# Patient Record
Sex: Female | Born: 1935
Health system: Southern US, Community
[De-identification: ages and names within clinical notes are randomized; demographics above are authoritative.]

## PROBLEM LIST (undated history)

## (undated) DIAGNOSIS — C801 Malignant (primary) neoplasm, unspecified: Secondary | ICD-10-CM

## (undated) DIAGNOSIS — Z973 Presence of spectacles and contact lenses: Secondary | ICD-10-CM

## (undated) DIAGNOSIS — M7989 Other specified soft tissue disorders: Secondary | ICD-10-CM

## (undated) DIAGNOSIS — E785 Hyperlipidemia, unspecified: Secondary | ICD-10-CM

## (undated) DIAGNOSIS — M1990 Unspecified osteoarthritis, unspecified site: Secondary | ICD-10-CM

## (undated) DIAGNOSIS — Z972 Presence of dental prosthetic device (complete) (partial): Secondary | ICD-10-CM

## (undated) DIAGNOSIS — R569 Unspecified convulsions: Secondary | ICD-10-CM

## (undated) HISTORY — DX: Other specified soft tissue disorders: M79.89

## (undated) HISTORY — DX: Hyperlipidemia, unspecified: E78.5

## (undated) HISTORY — DX: Unspecified osteoarthritis, unspecified site: M19.90

## (undated) HISTORY — DX: Unspecified convulsions: R56.9

## (undated) HISTORY — DX: Presence of dental prosthetic device (complete) (partial): Z97.2

## (undated) HISTORY — DX: Presence of spectacles and contact lenses: Z97.3

## (undated) HISTORY — DX: Malignant (primary) neoplasm, unspecified: C80.1

---

## 1979-11-15 HISTORY — PX: ABDOMINAL HYSTERECTOMY: SHX81

## 2010-05-27 ENCOUNTER — Encounter: Admission: RE | Admit: 2010-05-27 | Discharge: 2010-05-27 | Payer: Self-pay | Admitting: Family Medicine

## 2010-06-28 ENCOUNTER — Encounter: Admission: RE | Admit: 2010-06-28 | Discharge: 2010-06-28 | Payer: Self-pay | Admitting: Surgery

## 2010-06-28 ENCOUNTER — Ambulatory Visit (HOSPITAL_COMMUNITY): Admission: RE | Admit: 2010-06-28 | Discharge: 2010-06-28 | Payer: Self-pay | Admitting: Surgery

## 2010-07-14 ENCOUNTER — Ambulatory Visit (HOSPITAL_COMMUNITY): Admission: RE | Admit: 2010-07-14 | Discharge: 2010-07-14 | Payer: Self-pay | Admitting: Surgery

## 2010-07-22 ENCOUNTER — Ambulatory Visit: Payer: Self-pay | Admitting: Oncology

## 2010-10-24 ENCOUNTER — Encounter: Payer: Self-pay | Admitting: Family Medicine

## 2010-12-16 LAB — SURGICAL PCR SCREEN: Staphylococcus aureus: POSITIVE — AB

## 2010-12-16 LAB — BASIC METABOLIC PANEL
CO2: 29 mEq/L (ref 19–32)
Calcium: 9.1 mg/dL (ref 8.4–10.5)
Chloride: 107 mEq/L (ref 96–112)
Glucose, Bld: 82 mg/dL (ref 70–99)
Potassium: 4.5 mEq/L (ref 3.5–5.1)

## 2010-12-16 LAB — DIFFERENTIAL
Basophils Relative: 0 % (ref 0–1)
Eosinophils Relative: 11 % — ABNORMAL HIGH (ref 0–5)
Lymphs Abs: 3.1 10*3/uL (ref 0.7–4.0)
Monocytes Absolute: 0.7 10*3/uL (ref 0.1–1.0)
Monocytes Relative: 8 % (ref 3–12)

## 2010-12-16 LAB — CBC
HCT: 40.8 % (ref 36.0–46.0)
Hemoglobin: 13.2 g/dL (ref 12.0–15.0)
MCH: 26.9 pg (ref 26.0–34.0)
MCHC: 32.4 g/dL (ref 30.0–36.0)
MCV: 83.8 fL (ref 78.0–100.0)
MCV: 83.4 fL (ref 78.0–100.0)
RBC: 4.89 MIL/uL (ref 3.87–5.11)
RDW: 15.9 % — ABNORMAL HIGH (ref 11.5–15.5)
RDW: 16.1 % — ABNORMAL HIGH (ref 11.5–15.5)
WBC: 9.8 10*3/uL (ref 4.0–10.5)

## 2010-12-16 LAB — COMPREHENSIVE METABOLIC PANEL
AST: 24 U/L (ref 0–37)
Albumin: 3.9 g/dL (ref 3.5–5.2)
Alkaline Phosphatase: 96 U/L (ref 39–117)
BUN: 12 mg/dL (ref 6–23)
Calcium: 9.7 mg/dL (ref 8.4–10.5)
Chloride: 103 mEq/L (ref 96–112)
Creatinine, Ser: 0.73 mg/dL (ref 0.4–1.2)
GFR calc Af Amer: >60 mL/min (ref >60)
GFR calc non Af Amer: >60 mL/min (ref >60)
Glucose, Bld: 91 mg/dL (ref 70–99)
Potassium: 4.5 mEq/L (ref 3.5–5.1)
Sodium: 140 mEq/L (ref 135–145)
Total Bilirubin: 0.3 mg/dL (ref 0.3–1.2)

## 2011-02-01 ENCOUNTER — Encounter (INDEPENDENT_AMBULATORY_CARE_PROVIDER_SITE_OTHER): Payer: Self-pay | Admitting: Surgery

## 2011-07-15 ENCOUNTER — Ambulatory Visit (INDEPENDENT_AMBULATORY_CARE_PROVIDER_SITE_OTHER): Payer: Medicare Other | Admitting: Surgery

## 2011-07-15 ENCOUNTER — Encounter (INDEPENDENT_AMBULATORY_CARE_PROVIDER_SITE_OTHER): Payer: Self-pay | Admitting: Surgery

## 2011-07-15 VITALS — BP 124/76 | HR 80 | Temp 97.4°F | Resp 16 | Ht 63.0 in | Wt 218.2 lb

## 2011-07-15 DIAGNOSIS — Z853 Personal history of malignant neoplasm of breast: Secondary | ICD-10-CM

## 2011-07-15 NOTE — Patient Instructions (Signed)
Follow up in 1 year.

## 2011-07-15 NOTE — Progress Notes (Signed)
Subjective:     Patient ID: Lisa Phillips, female   DOB: 1930/11/29, 77 y.o.   MRN: [id number]  HPI The patient presents for a 6 month followup after left breast lumpectomy for DCIS. Date of surgery was 1012 2011. She is doing well. She has no complaints. She is followed closely and aspirate. She is due for mammogram this month. She denies any breast pain, breast mass, nipple discharge or any new changes to her breast. She is to have knee replacement surgery she tells me.   Review of Systems  Constitutional: Negative.   HENT: Negative.   Respiratory: Negative for cough and chest tightness.   Genitourinary: Negative.   Musculoskeletal: Positive for joint swelling, arthralgias and gait problem.       Objective:   Physical Exam  Constitutional: She appears well-developed and well-nourished.  HENT:  Head: Normocephalic and atraumatic.  Eyes: EOM are normal. Pupils are equal, round, and reactive to light.  Neck: Normal range of motion. Neck supple.  Pulmonary/Chest:       Left breast shows postsurgical scar around left lateral nipple. No masses noted. Left visible normal. No right breast mass palpated today. Right axilla normal       Assessment:     Left breast DCIS HISTORY Date of surgery 07/14/2011 lumpectomy    Plan:     Follow up 1 year.   Mammogram this month in [place].

## 2011-10-05 DIAGNOSIS — M1990 Unspecified osteoarthritis, unspecified site: Secondary | ICD-10-CM | POA: Diagnosis not present

## 2011-10-05 DIAGNOSIS — J111 Influenza due to unidentified influenza virus with other respiratory manifestations: Secondary | ICD-10-CM | POA: Diagnosis not present

## 2011-10-21 DIAGNOSIS — Z01818 Encounter for other preprocedural examination: Secondary | ICD-10-CM | POA: Diagnosis not present

## 2011-10-25 DIAGNOSIS — G40909 Epilepsy, unspecified, not intractable, without status epilepticus: Secondary | ICD-10-CM | POA: Diagnosis not present

## 2011-10-25 DIAGNOSIS — M1710 Unilateral primary osteoarthritis, unspecified knee: Secondary | ICD-10-CM | POA: Diagnosis not present

## 2011-10-25 DIAGNOSIS — E559 Vitamin D deficiency, unspecified: Secondary | ICD-10-CM | POA: Diagnosis not present

## 2011-10-25 DIAGNOSIS — R269 Unspecified abnormalities of gait and mobility: Secondary | ICD-10-CM | POA: Diagnosis not present

## 2011-10-25 DIAGNOSIS — Z96659 Presence of unspecified artificial knee joint: Secondary | ICD-10-CM | POA: Diagnosis not present

## 2011-10-25 DIAGNOSIS — G8918 Other acute postprocedural pain: Secondary | ICD-10-CM | POA: Diagnosis not present

## 2011-10-25 DIAGNOSIS — E785 Hyperlipidemia, unspecified: Secondary | ICD-10-CM | POA: Diagnosis not present

## 2011-10-25 DIAGNOSIS — M25569 Pain in unspecified knee: Secondary | ICD-10-CM | POA: Diagnosis not present

## 2011-10-25 DIAGNOSIS — R569 Unspecified convulsions: Secondary | ICD-10-CM | POA: Diagnosis not present

## 2011-10-25 DIAGNOSIS — Z471 Aftercare following joint replacement surgery: Secondary | ICD-10-CM | POA: Diagnosis not present

## 2011-10-28 DIAGNOSIS — D638 Anemia in other chronic diseases classified elsewhere: Secondary | ICD-10-CM | POA: Diagnosis not present

## 2011-10-28 DIAGNOSIS — N390 Urinary tract infection, site not specified: Secondary | ICD-10-CM | POA: Diagnosis not present

## 2011-10-28 DIAGNOSIS — R569 Unspecified convulsions: Secondary | ICD-10-CM | POA: Diagnosis not present

## 2011-10-28 DIAGNOSIS — Z471 Aftercare following joint replacement surgery: Secondary | ICD-10-CM | POA: Diagnosis not present

## 2011-10-28 DIAGNOSIS — R269 Unspecified abnormalities of gait and mobility: Secondary | ICD-10-CM | POA: Diagnosis not present

## 2011-10-28 DIAGNOSIS — M1710 Unilateral primary osteoarthritis, unspecified knee: Secondary | ICD-10-CM | POA: Diagnosis not present

## 2011-10-28 DIAGNOSIS — M25569 Pain in unspecified knee: Secondary | ICD-10-CM | POA: Diagnosis not present

## 2011-10-28 DIAGNOSIS — Z96659 Presence of unspecified artificial knee joint: Secondary | ICD-10-CM | POA: Diagnosis not present

## 2011-10-28 DIAGNOSIS — E785 Hyperlipidemia, unspecified: Secondary | ICD-10-CM | POA: Diagnosis not present

## 2011-10-28 DIAGNOSIS — G8918 Other acute postprocedural pain: Secondary | ICD-10-CM | POA: Diagnosis not present

## 2011-10-28 DIAGNOSIS — E559 Vitamin D deficiency, unspecified: Secondary | ICD-10-CM | POA: Diagnosis not present

## 2011-10-31 DIAGNOSIS — E785 Hyperlipidemia, unspecified: Secondary | ICD-10-CM | POA: Diagnosis not present

## 2011-10-31 DIAGNOSIS — D638 Anemia in other chronic diseases classified elsewhere: Secondary | ICD-10-CM | POA: Diagnosis not present

## 2011-10-31 DIAGNOSIS — Z96659 Presence of unspecified artificial knee joint: Secondary | ICD-10-CM | POA: Diagnosis not present

## 2011-10-31 DIAGNOSIS — G8918 Other acute postprocedural pain: Secondary | ICD-10-CM | POA: Diagnosis not present

## 2011-11-14 DIAGNOSIS — Z7901 Long term (current) use of anticoagulants: Secondary | ICD-10-CM | POA: Diagnosis not present

## 2011-11-14 DIAGNOSIS — Z5181 Encounter for therapeutic drug level monitoring: Secondary | ICD-10-CM | POA: Diagnosis not present

## 2011-11-14 DIAGNOSIS — Z79899 Other long term (current) drug therapy: Secondary | ICD-10-CM | POA: Diagnosis not present

## 2011-11-15 DIAGNOSIS — IMO0001 Reserved for inherently not codable concepts without codable children: Secondary | ICD-10-CM | POA: Diagnosis not present

## 2011-11-15 DIAGNOSIS — R269 Unspecified abnormalities of gait and mobility: Secondary | ICD-10-CM | POA: Diagnosis not present

## 2011-11-15 DIAGNOSIS — Z96659 Presence of unspecified artificial knee joint: Secondary | ICD-10-CM | POA: Diagnosis not present

## 2011-11-17 DIAGNOSIS — Z96659 Presence of unspecified artificial knee joint: Secondary | ICD-10-CM | POA: Diagnosis not present

## 2011-11-17 DIAGNOSIS — IMO0001 Reserved for inherently not codable concepts without codable children: Secondary | ICD-10-CM | POA: Diagnosis not present

## 2011-11-17 DIAGNOSIS — R269 Unspecified abnormalities of gait and mobility: Secondary | ICD-10-CM | POA: Diagnosis not present

## 2011-11-21 DIAGNOSIS — IMO0001 Reserved for inherently not codable concepts without codable children: Secondary | ICD-10-CM | POA: Diagnosis not present

## 2011-11-21 DIAGNOSIS — R269 Unspecified abnormalities of gait and mobility: Secondary | ICD-10-CM | POA: Diagnosis not present

## 2011-11-21 DIAGNOSIS — Z96659 Presence of unspecified artificial knee joint: Secondary | ICD-10-CM | POA: Diagnosis not present

## 2011-11-22 DIAGNOSIS — Z96659 Presence of unspecified artificial knee joint: Secondary | ICD-10-CM | POA: Diagnosis not present

## 2011-11-22 DIAGNOSIS — R269 Unspecified abnormalities of gait and mobility: Secondary | ICD-10-CM | POA: Diagnosis not present

## 2011-11-22 DIAGNOSIS — IMO0001 Reserved for inherently not codable concepts without codable children: Secondary | ICD-10-CM | POA: Diagnosis not present

## 2011-11-23 DIAGNOSIS — Z96659 Presence of unspecified artificial knee joint: Secondary | ICD-10-CM | POA: Diagnosis not present

## 2011-11-23 DIAGNOSIS — IMO0001 Reserved for inherently not codable concepts without codable children: Secondary | ICD-10-CM | POA: Diagnosis not present

## 2011-11-23 DIAGNOSIS — R269 Unspecified abnormalities of gait and mobility: Secondary | ICD-10-CM | POA: Diagnosis not present

## 2011-11-24 DIAGNOSIS — E559 Vitamin D deficiency, unspecified: Secondary | ICD-10-CM | POA: Diagnosis not present

## 2011-11-24 DIAGNOSIS — E782 Mixed hyperlipidemia: Secondary | ICD-10-CM | POA: Diagnosis not present

## 2011-11-24 DIAGNOSIS — R269 Unspecified abnormalities of gait and mobility: Secondary | ICD-10-CM | POA: Diagnosis not present

## 2011-11-24 DIAGNOSIS — Z7901 Long term (current) use of anticoagulants: Secondary | ICD-10-CM | POA: Diagnosis not present

## 2011-11-24 DIAGNOSIS — IMO0001 Reserved for inherently not codable concepts without codable children: Secondary | ICD-10-CM | POA: Diagnosis not present

## 2011-11-24 DIAGNOSIS — Z96659 Presence of unspecified artificial knee joint: Secondary | ICD-10-CM | POA: Diagnosis not present

## 2011-11-24 DIAGNOSIS — Z79899 Other long term (current) drug therapy: Secondary | ICD-10-CM | POA: Diagnosis not present

## 2011-11-24 DIAGNOSIS — M1990 Unspecified osteoarthritis, unspecified site: Secondary | ICD-10-CM | POA: Diagnosis not present

## 2011-11-28 DIAGNOSIS — Z96659 Presence of unspecified artificial knee joint: Secondary | ICD-10-CM | POA: Diagnosis not present

## 2011-11-28 DIAGNOSIS — R269 Unspecified abnormalities of gait and mobility: Secondary | ICD-10-CM | POA: Diagnosis not present

## 2011-11-28 DIAGNOSIS — IMO0001 Reserved for inherently not codable concepts without codable children: Secondary | ICD-10-CM | POA: Diagnosis not present

## 2011-11-29 DIAGNOSIS — Z96659 Presence of unspecified artificial knee joint: Secondary | ICD-10-CM | POA: Diagnosis not present

## 2011-11-29 DIAGNOSIS — IMO0001 Reserved for inherently not codable concepts without codable children: Secondary | ICD-10-CM | POA: Diagnosis not present

## 2011-11-29 DIAGNOSIS — R269 Unspecified abnormalities of gait and mobility: Secondary | ICD-10-CM | POA: Diagnosis not present

## 2011-12-01 DIAGNOSIS — Z96659 Presence of unspecified artificial knee joint: Secondary | ICD-10-CM | POA: Diagnosis not present

## 2011-12-01 DIAGNOSIS — R269 Unspecified abnormalities of gait and mobility: Secondary | ICD-10-CM | POA: Diagnosis not present

## 2011-12-01 DIAGNOSIS — IMO0001 Reserved for inherently not codable concepts without codable children: Secondary | ICD-10-CM | POA: Diagnosis not present

## 2011-12-05 DIAGNOSIS — Z96659 Presence of unspecified artificial knee joint: Secondary | ICD-10-CM | POA: Diagnosis not present

## 2011-12-05 DIAGNOSIS — R269 Unspecified abnormalities of gait and mobility: Secondary | ICD-10-CM | POA: Diagnosis not present

## 2011-12-05 DIAGNOSIS — IMO0001 Reserved for inherently not codable concepts without codable children: Secondary | ICD-10-CM | POA: Diagnosis not present

## 2011-12-06 DIAGNOSIS — R269 Unspecified abnormalities of gait and mobility: Secondary | ICD-10-CM | POA: Diagnosis not present

## 2011-12-06 DIAGNOSIS — Z96659 Presence of unspecified artificial knee joint: Secondary | ICD-10-CM | POA: Diagnosis not present

## 2011-12-06 DIAGNOSIS — IMO0001 Reserved for inherently not codable concepts without codable children: Secondary | ICD-10-CM | POA: Diagnosis not present

## 2011-12-09 DIAGNOSIS — M25569 Pain in unspecified knee: Secondary | ICD-10-CM | POA: Diagnosis not present

## 2011-12-12 DIAGNOSIS — C50119 Malignant neoplasm of central portion of unspecified female breast: Secondary | ICD-10-CM | POA: Diagnosis not present

## 2011-12-14 DIAGNOSIS — M25569 Pain in unspecified knee: Secondary | ICD-10-CM | POA: Diagnosis not present

## 2011-12-16 DIAGNOSIS — M25569 Pain in unspecified knee: Secondary | ICD-10-CM | POA: Diagnosis not present

## 2011-12-21 DIAGNOSIS — M1710 Unilateral primary osteoarthritis, unspecified knee: Secondary | ICD-10-CM | POA: Diagnosis not present

## 2011-12-26 DIAGNOSIS — M25569 Pain in unspecified knee: Secondary | ICD-10-CM | POA: Diagnosis not present

## 2011-12-29 DIAGNOSIS — M25569 Pain in unspecified knee: Secondary | ICD-10-CM | POA: Diagnosis not present

## 2012-01-13 DIAGNOSIS — M25569 Pain in unspecified knee: Secondary | ICD-10-CM | POA: Diagnosis not present

## 2012-01-16 DIAGNOSIS — M25569 Pain in unspecified knee: Secondary | ICD-10-CM | POA: Diagnosis not present

## 2012-01-26 DIAGNOSIS — M25569 Pain in unspecified knee: Secondary | ICD-10-CM | POA: Diagnosis not present

## 2012-02-20 DIAGNOSIS — M1710 Unilateral primary osteoarthritis, unspecified knee: Secondary | ICD-10-CM | POA: Diagnosis not present

## 2012-02-20 DIAGNOSIS — M25569 Pain in unspecified knee: Secondary | ICD-10-CM | POA: Diagnosis not present

## 2012-02-20 DIAGNOSIS — Z6836 Body mass index (BMI) 36.0-36.9, adult: Secondary | ICD-10-CM | POA: Diagnosis not present

## 2012-04-30 DIAGNOSIS — N6009 Solitary cyst of unspecified breast: Secondary | ICD-10-CM | POA: Diagnosis not present

## 2012-04-30 DIAGNOSIS — C50119 Malignant neoplasm of central portion of unspecified female breast: Secondary | ICD-10-CM | POA: Diagnosis not present

## 2012-05-04 DIAGNOSIS — M1710 Unilateral primary osteoarthritis, unspecified knee: Secondary | ICD-10-CM | POA: Diagnosis not present

## 2012-05-04 DIAGNOSIS — Z09 Encounter for follow-up examination after completed treatment for conditions other than malignant neoplasm: Secondary | ICD-10-CM | POA: Diagnosis not present

## 2012-05-04 DIAGNOSIS — Z853 Personal history of malignant neoplasm of breast: Secondary | ICD-10-CM | POA: Diagnosis not present

## 2012-05-04 DIAGNOSIS — M25569 Pain in unspecified knee: Secondary | ICD-10-CM | POA: Diagnosis not present

## 2012-05-16 DIAGNOSIS — M1710 Unilateral primary osteoarthritis, unspecified knee: Secondary | ICD-10-CM | POA: Diagnosis not present

## 2012-05-23 DIAGNOSIS — M1710 Unilateral primary osteoarthritis, unspecified knee: Secondary | ICD-10-CM | POA: Diagnosis not present

## 2012-05-30 DIAGNOSIS — M1710 Unilateral primary osteoarthritis, unspecified knee: Secondary | ICD-10-CM | POA: Diagnosis not present

## 2012-06-11 DIAGNOSIS — M1710 Unilateral primary osteoarthritis, unspecified knee: Secondary | ICD-10-CM | POA: Diagnosis not present

## 2012-06-11 DIAGNOSIS — M25569 Pain in unspecified knee: Secondary | ICD-10-CM | POA: Diagnosis not present

## 2012-06-13 DIAGNOSIS — H2700 Aphakia, unspecified eye: Secondary | ICD-10-CM | POA: Diagnosis not present

## 2012-07-06 DIAGNOSIS — Z23 Encounter for immunization: Secondary | ICD-10-CM | POA: Diagnosis not present

## 2012-07-11 DIAGNOSIS — M1710 Unilateral primary osteoarthritis, unspecified knee: Secondary | ICD-10-CM | POA: Diagnosis not present

## 2012-07-11 DIAGNOSIS — M25569 Pain in unspecified knee: Secondary | ICD-10-CM | POA: Diagnosis not present

## 2012-07-17 DIAGNOSIS — E559 Vitamin D deficiency, unspecified: Secondary | ICD-10-CM | POA: Diagnosis not present

## 2012-07-17 DIAGNOSIS — E782 Mixed hyperlipidemia: Secondary | ICD-10-CM | POA: Diagnosis not present

## 2012-07-17 DIAGNOSIS — G40909 Epilepsy, unspecified, not intractable, without status epilepticus: Secondary | ICD-10-CM | POA: Diagnosis not present

## 2012-07-17 DIAGNOSIS — Z79899 Other long term (current) drug therapy: Secondary | ICD-10-CM | POA: Diagnosis not present

## 2012-11-02 DIAGNOSIS — N760 Acute vaginitis: Secondary | ICD-10-CM | POA: Diagnosis not present

## 2012-12-17 DIAGNOSIS — M6281 Muscle weakness (generalized): Secondary | ICD-10-CM | POA: Diagnosis not present

## 2012-12-17 DIAGNOSIS — M1710 Unilateral primary osteoarthritis, unspecified knee: Secondary | ICD-10-CM | POA: Diagnosis not present

## 2013-01-14 DIAGNOSIS — H1045 Other chronic allergic conjunctivitis: Secondary | ICD-10-CM | POA: Diagnosis not present

## 2013-01-15 DIAGNOSIS — Z1331 Encounter for screening for depression: Secondary | ICD-10-CM | POA: Diagnosis not present

## 2013-01-15 DIAGNOSIS — E782 Mixed hyperlipidemia: Secondary | ICD-10-CM | POA: Diagnosis not present

## 2013-01-15 DIAGNOSIS — Z9181 History of falling: Secondary | ICD-10-CM | POA: Diagnosis not present

## 2013-01-15 DIAGNOSIS — N183 Chronic kidney disease, stage 3 unspecified: Secondary | ICD-10-CM | POA: Diagnosis not present

## 2013-01-15 DIAGNOSIS — E559 Vitamin D deficiency, unspecified: Secondary | ICD-10-CM | POA: Diagnosis not present

## 2013-01-15 DIAGNOSIS — G40909 Epilepsy, unspecified, not intractable, without status epilepticus: Secondary | ICD-10-CM | POA: Diagnosis not present

## 2013-01-15 DIAGNOSIS — Z79899 Other long term (current) drug therapy: Secondary | ICD-10-CM | POA: Diagnosis not present

## 2013-01-15 DIAGNOSIS — N039 Chronic nephritic syndrome with unspecified morphologic changes: Secondary | ICD-10-CM | POA: Diagnosis not present

## 2013-02-19 DIAGNOSIS — M1710 Unilateral primary osteoarthritis, unspecified knee: Secondary | ICD-10-CM | POA: Diagnosis not present

## 2013-02-26 DIAGNOSIS — Z6838 Body mass index (BMI) 38.0-38.9, adult: Secondary | ICD-10-CM | POA: Diagnosis not present

## 2013-02-26 DIAGNOSIS — R9431 Abnormal electrocardiogram [ECG] [EKG]: Secondary | ICD-10-CM | POA: Diagnosis not present

## 2013-02-26 DIAGNOSIS — Z01818 Encounter for other preprocedural examination: Secondary | ICD-10-CM | POA: Diagnosis not present

## 2013-02-26 DIAGNOSIS — M1990 Unspecified osteoarthritis, unspecified site: Secondary | ICD-10-CM | POA: Diagnosis not present

## 2013-03-05 DIAGNOSIS — R5383 Other fatigue: Secondary | ICD-10-CM | POA: Diagnosis not present

## 2013-03-05 DIAGNOSIS — Z7901 Long term (current) use of anticoagulants: Secondary | ICD-10-CM | POA: Diagnosis not present

## 2013-03-05 DIAGNOSIS — R9431 Abnormal electrocardiogram [ECG] [EKG]: Secondary | ICD-10-CM | POA: Diagnosis not present

## 2013-03-05 DIAGNOSIS — Z01818 Encounter for other preprocedural examination: Secondary | ICD-10-CM | POA: Diagnosis not present

## 2013-03-05 DIAGNOSIS — R5381 Other malaise: Secondary | ICD-10-CM | POA: Diagnosis not present

## 2013-03-11 DIAGNOSIS — M1710 Unilateral primary osteoarthritis, unspecified knee: Secondary | ICD-10-CM | POA: Diagnosis not present

## 2013-03-12 DIAGNOSIS — G8918 Other acute postprocedural pain: Secondary | ICD-10-CM | POA: Diagnosis not present

## 2013-03-12 DIAGNOSIS — E785 Hyperlipidemia, unspecified: Secondary | ICD-10-CM | POA: Diagnosis not present

## 2013-03-12 DIAGNOSIS — E559 Vitamin D deficiency, unspecified: Secondary | ICD-10-CM | POA: Diagnosis present

## 2013-03-12 DIAGNOSIS — Z862 Personal history of diseases of the blood and blood-forming organs and certain disorders involving the immune mechanism: Secondary | ICD-10-CM | POA: Diagnosis not present

## 2013-03-12 DIAGNOSIS — G40909 Epilepsy, unspecified, not intractable, without status epilepticus: Secondary | ICD-10-CM | POA: Diagnosis not present

## 2013-03-12 DIAGNOSIS — D638 Anemia in other chronic diseases classified elsewhere: Secondary | ICD-10-CM | POA: Diagnosis not present

## 2013-03-12 DIAGNOSIS — J4489 Other specified chronic obstructive pulmonary disease: Secondary | ICD-10-CM | POA: Diagnosis not present

## 2013-03-12 DIAGNOSIS — M818 Other osteoporosis without current pathological fracture: Secondary | ICD-10-CM | POA: Diagnosis not present

## 2013-03-12 DIAGNOSIS — D649 Anemia, unspecified: Secondary | ICD-10-CM | POA: Diagnosis present

## 2013-03-12 DIAGNOSIS — K219 Gastro-esophageal reflux disease without esophagitis: Secondary | ICD-10-CM | POA: Diagnosis not present

## 2013-03-12 DIAGNOSIS — Z85828 Personal history of other malignant neoplasm of skin: Secondary | ICD-10-CM | POA: Diagnosis not present

## 2013-03-12 DIAGNOSIS — R030 Elevated blood-pressure reading, without diagnosis of hypertension: Secondary | ICD-10-CM | POA: Diagnosis present

## 2013-03-12 DIAGNOSIS — R569 Unspecified convulsions: Secondary | ICD-10-CM | POA: Diagnosis not present

## 2013-03-12 DIAGNOSIS — J9600 Acute respiratory failure, unspecified whether with hypoxia or hypercapnia: Secondary | ICD-10-CM | POA: Diagnosis not present

## 2013-03-12 DIAGNOSIS — I10 Essential (primary) hypertension: Secondary | ICD-10-CM | POA: Diagnosis not present

## 2013-03-12 DIAGNOSIS — IMO0002 Reserved for concepts with insufficient information to code with codable children: Secondary | ICD-10-CM | POA: Diagnosis not present

## 2013-03-12 DIAGNOSIS — M159 Polyosteoarthritis, unspecified: Secondary | ICD-10-CM | POA: Diagnosis present

## 2013-03-12 DIAGNOSIS — Z853 Personal history of malignant neoplasm of breast: Secondary | ICD-10-CM | POA: Diagnosis not present

## 2013-03-12 DIAGNOSIS — M1710 Unilateral primary osteoarthritis, unspecified knee: Secondary | ICD-10-CM | POA: Diagnosis not present

## 2013-03-12 DIAGNOSIS — Z96659 Presence of unspecified artificial knee joint: Secondary | ICD-10-CM | POA: Diagnosis not present

## 2013-03-12 DIAGNOSIS — Z22321 Carrier or suspected carrier of Methicillin susceptible Staphylococcus aureus: Secondary | ICD-10-CM | POA: Diagnosis not present

## 2013-03-12 DIAGNOSIS — J9620 Acute and chronic respiratory failure, unspecified whether with hypoxia or hypercapnia: Secondary | ICD-10-CM | POA: Diagnosis not present

## 2013-03-12 DIAGNOSIS — R269 Unspecified abnormalities of gait and mobility: Secondary | ICD-10-CM | POA: Diagnosis not present

## 2013-03-12 DIAGNOSIS — M810 Age-related osteoporosis without current pathological fracture: Secondary | ICD-10-CM | POA: Diagnosis not present

## 2013-03-12 DIAGNOSIS — J95822 Acute and chronic postprocedural respiratory failure: Secondary | ICD-10-CM | POA: Diagnosis not present

## 2013-03-12 DIAGNOSIS — J449 Chronic obstructive pulmonary disease, unspecified: Secondary | ICD-10-CM | POA: Diagnosis not present

## 2013-03-13 DIAGNOSIS — J9620 Acute and chronic respiratory failure, unspecified whether with hypoxia or hypercapnia: Secondary | ICD-10-CM | POA: Diagnosis not present

## 2013-03-13 DIAGNOSIS — K219 Gastro-esophageal reflux disease without esophagitis: Secondary | ICD-10-CM | POA: Diagnosis not present

## 2013-03-13 DIAGNOSIS — G40909 Epilepsy, unspecified, not intractable, without status epilepticus: Secondary | ICD-10-CM | POA: Diagnosis not present

## 2013-03-13 DIAGNOSIS — E785 Hyperlipidemia, unspecified: Secondary | ICD-10-CM | POA: Diagnosis not present

## 2013-03-14 DIAGNOSIS — Z96659 Presence of unspecified artificial knee joint: Secondary | ICD-10-CM | POA: Diagnosis not present

## 2013-03-14 DIAGNOSIS — E785 Hyperlipidemia, unspecified: Secondary | ICD-10-CM | POA: Diagnosis not present

## 2013-03-14 DIAGNOSIS — J9620 Acute and chronic respiratory failure, unspecified whether with hypoxia or hypercapnia: Secondary | ICD-10-CM | POA: Diagnosis not present

## 2013-03-14 DIAGNOSIS — K219 Gastro-esophageal reflux disease without esophagitis: Secondary | ICD-10-CM | POA: Diagnosis not present

## 2013-03-15 DIAGNOSIS — R569 Unspecified convulsions: Secondary | ICD-10-CM | POA: Diagnosis not present

## 2013-03-15 DIAGNOSIS — M1710 Unilateral primary osteoarthritis, unspecified knee: Secondary | ICD-10-CM | POA: Diagnosis not present

## 2013-03-15 DIAGNOSIS — K219 Gastro-esophageal reflux disease without esophagitis: Secondary | ICD-10-CM | POA: Diagnosis not present

## 2013-03-15 DIAGNOSIS — D638 Anemia in other chronic diseases classified elsewhere: Secondary | ICD-10-CM | POA: Diagnosis not present

## 2013-03-15 DIAGNOSIS — I10 Essential (primary) hypertension: Secondary | ICD-10-CM | POA: Diagnosis not present

## 2013-03-15 DIAGNOSIS — Z862 Personal history of diseases of the blood and blood-forming organs and certain disorders involving the immune mechanism: Secondary | ICD-10-CM | POA: Diagnosis not present

## 2013-03-15 DIAGNOSIS — Z85828 Personal history of other malignant neoplasm of skin: Secondary | ICD-10-CM | POA: Diagnosis not present

## 2013-03-15 DIAGNOSIS — M810 Age-related osteoporosis without current pathological fracture: Secondary | ICD-10-CM | POA: Diagnosis not present

## 2013-03-15 DIAGNOSIS — Z96659 Presence of unspecified artificial knee joint: Secondary | ICD-10-CM | POA: Diagnosis not present

## 2013-03-15 DIAGNOSIS — J9600 Acute respiratory failure, unspecified whether with hypoxia or hypercapnia: Secondary | ICD-10-CM | POA: Diagnosis not present

## 2013-03-15 DIAGNOSIS — G8918 Other acute postprocedural pain: Secondary | ICD-10-CM | POA: Diagnosis not present

## 2013-03-15 DIAGNOSIS — IMO0002 Reserved for concepts with insufficient information to code with codable children: Secondary | ICD-10-CM | POA: Diagnosis not present

## 2013-03-15 DIAGNOSIS — E785 Hyperlipidemia, unspecified: Secondary | ICD-10-CM | POA: Diagnosis not present

## 2013-03-15 DIAGNOSIS — G40909 Epilepsy, unspecified, not intractable, without status epilepticus: Secondary | ICD-10-CM | POA: Diagnosis not present

## 2013-03-15 DIAGNOSIS — Z853 Personal history of malignant neoplasm of breast: Secondary | ICD-10-CM | POA: Diagnosis not present

## 2013-03-15 DIAGNOSIS — J449 Chronic obstructive pulmonary disease, unspecified: Secondary | ICD-10-CM | POA: Diagnosis not present

## 2013-03-20 DIAGNOSIS — Z96659 Presence of unspecified artificial knee joint: Secondary | ICD-10-CM | POA: Diagnosis not present

## 2013-03-20 DIAGNOSIS — R569 Unspecified convulsions: Secondary | ICD-10-CM | POA: Diagnosis not present

## 2013-03-20 DIAGNOSIS — G8918 Other acute postprocedural pain: Secondary | ICD-10-CM | POA: Diagnosis not present

## 2013-03-20 DIAGNOSIS — D638 Anemia in other chronic diseases classified elsewhere: Secondary | ICD-10-CM | POA: Diagnosis not present

## 2013-03-31 DIAGNOSIS — M6281 Muscle weakness (generalized): Secondary | ICD-10-CM | POA: Diagnosis not present

## 2013-03-31 DIAGNOSIS — R269 Unspecified abnormalities of gait and mobility: Secondary | ICD-10-CM | POA: Diagnosis not present

## 2013-03-31 DIAGNOSIS — Z471 Aftercare following joint replacement surgery: Secondary | ICD-10-CM | POA: Diagnosis not present

## 2013-03-31 DIAGNOSIS — IMO0001 Reserved for inherently not codable concepts without codable children: Secondary | ICD-10-CM | POA: Diagnosis not present

## 2013-03-31 DIAGNOSIS — M25569 Pain in unspecified knee: Secondary | ICD-10-CM | POA: Diagnosis not present

## 2013-03-31 DIAGNOSIS — Z96659 Presence of unspecified artificial knee joint: Secondary | ICD-10-CM | POA: Diagnosis not present

## 2013-04-02 DIAGNOSIS — IMO0001 Reserved for inherently not codable concepts without codable children: Secondary | ICD-10-CM | POA: Diagnosis not present

## 2013-04-02 DIAGNOSIS — M6281 Muscle weakness (generalized): Secondary | ICD-10-CM | POA: Diagnosis not present

## 2013-04-02 DIAGNOSIS — R269 Unspecified abnormalities of gait and mobility: Secondary | ICD-10-CM | POA: Diagnosis not present

## 2013-04-02 DIAGNOSIS — Z471 Aftercare following joint replacement surgery: Secondary | ICD-10-CM | POA: Diagnosis not present

## 2013-04-02 DIAGNOSIS — Z96659 Presence of unspecified artificial knee joint: Secondary | ICD-10-CM | POA: Diagnosis not present

## 2013-04-02 DIAGNOSIS — M25569 Pain in unspecified knee: Secondary | ICD-10-CM | POA: Diagnosis not present

## 2013-04-04 DIAGNOSIS — M25569 Pain in unspecified knee: Secondary | ICD-10-CM | POA: Diagnosis not present

## 2013-04-04 DIAGNOSIS — Z96659 Presence of unspecified artificial knee joint: Secondary | ICD-10-CM | POA: Diagnosis not present

## 2013-04-04 DIAGNOSIS — IMO0001 Reserved for inherently not codable concepts without codable children: Secondary | ICD-10-CM | POA: Diagnosis not present

## 2013-04-04 DIAGNOSIS — R269 Unspecified abnormalities of gait and mobility: Secondary | ICD-10-CM | POA: Diagnosis not present

## 2013-04-04 DIAGNOSIS — M6281 Muscle weakness (generalized): Secondary | ICD-10-CM | POA: Diagnosis not present

## 2013-04-04 DIAGNOSIS — Z471 Aftercare following joint replacement surgery: Secondary | ICD-10-CM | POA: Diagnosis not present

## 2013-04-08 DIAGNOSIS — M25569 Pain in unspecified knee: Secondary | ICD-10-CM | POA: Diagnosis not present

## 2013-04-08 DIAGNOSIS — IMO0001 Reserved for inherently not codable concepts without codable children: Secondary | ICD-10-CM | POA: Diagnosis not present

## 2013-04-08 DIAGNOSIS — Z471 Aftercare following joint replacement surgery: Secondary | ICD-10-CM | POA: Diagnosis not present

## 2013-04-08 DIAGNOSIS — Z96659 Presence of unspecified artificial knee joint: Secondary | ICD-10-CM | POA: Diagnosis not present

## 2013-04-08 DIAGNOSIS — M6281 Muscle weakness (generalized): Secondary | ICD-10-CM | POA: Diagnosis not present

## 2013-04-08 DIAGNOSIS — R269 Unspecified abnormalities of gait and mobility: Secondary | ICD-10-CM | POA: Diagnosis not present

## 2013-04-09 DIAGNOSIS — M25569 Pain in unspecified knee: Secondary | ICD-10-CM | POA: Diagnosis not present

## 2013-04-09 DIAGNOSIS — IMO0001 Reserved for inherently not codable concepts without codable children: Secondary | ICD-10-CM | POA: Diagnosis not present

## 2013-04-09 DIAGNOSIS — R269 Unspecified abnormalities of gait and mobility: Secondary | ICD-10-CM | POA: Diagnosis not present

## 2013-04-09 DIAGNOSIS — M6281 Muscle weakness (generalized): Secondary | ICD-10-CM | POA: Diagnosis not present

## 2013-04-09 DIAGNOSIS — Z96659 Presence of unspecified artificial knee joint: Secondary | ICD-10-CM | POA: Diagnosis not present

## 2013-04-09 DIAGNOSIS — Z471 Aftercare following joint replacement surgery: Secondary | ICD-10-CM | POA: Diagnosis not present

## 2013-04-10 DIAGNOSIS — M6281 Muscle weakness (generalized): Secondary | ICD-10-CM | POA: Diagnosis not present

## 2013-04-10 DIAGNOSIS — IMO0001 Reserved for inherently not codable concepts without codable children: Secondary | ICD-10-CM | POA: Diagnosis not present

## 2013-04-10 DIAGNOSIS — R269 Unspecified abnormalities of gait and mobility: Secondary | ICD-10-CM | POA: Diagnosis not present

## 2013-04-10 DIAGNOSIS — Z471 Aftercare following joint replacement surgery: Secondary | ICD-10-CM | POA: Diagnosis not present

## 2013-04-10 DIAGNOSIS — M25569 Pain in unspecified knee: Secondary | ICD-10-CM | POA: Diagnosis not present

## 2013-04-10 DIAGNOSIS — Z96659 Presence of unspecified artificial knee joint: Secondary | ICD-10-CM | POA: Diagnosis not present

## 2013-04-12 DIAGNOSIS — R609 Edema, unspecified: Secondary | ICD-10-CM | POA: Diagnosis not present

## 2013-04-12 DIAGNOSIS — D649 Anemia, unspecified: Secondary | ICD-10-CM | POA: Diagnosis not present

## 2013-05-02 DIAGNOSIS — L821 Other seborrheic keratosis: Secondary | ICD-10-CM | POA: Diagnosis not present

## 2013-05-02 DIAGNOSIS — C44211 Basal cell carcinoma of skin of unspecified ear and external auricular canal: Secondary | ICD-10-CM | POA: Diagnosis not present

## 2013-05-02 DIAGNOSIS — L570 Actinic keratosis: Secondary | ICD-10-CM | POA: Diagnosis not present

## 2013-05-07 DIAGNOSIS — C4330 Malignant melanoma of unspecified part of face: Secondary | ICD-10-CM | POA: Diagnosis not present

## 2013-05-13 DIAGNOSIS — C50119 Malignant neoplasm of central portion of unspecified female breast: Secondary | ICD-10-CM | POA: Diagnosis not present

## 2013-05-16 DIAGNOSIS — Z853 Personal history of malignant neoplasm of breast: Secondary | ICD-10-CM | POA: Diagnosis not present

## 2013-05-16 DIAGNOSIS — Z09 Encounter for follow-up examination after completed treatment for conditions other than malignant neoplasm: Secondary | ICD-10-CM | POA: Diagnosis not present

## 2013-05-22 DIAGNOSIS — C44211 Basal cell carcinoma of skin of unspecified ear and external auricular canal: Secondary | ICD-10-CM | POA: Diagnosis not present

## 2013-05-28 DIAGNOSIS — C4330 Malignant melanoma of unspecified part of face: Secondary | ICD-10-CM | POA: Diagnosis not present

## 2013-06-05 DIAGNOSIS — Z96659 Presence of unspecified artificial knee joint: Secondary | ICD-10-CM | POA: Diagnosis not present

## 2013-06-12 DIAGNOSIS — Z86010 Personal history of colonic polyps: Secondary | ICD-10-CM | POA: Diagnosis not present

## 2013-06-12 DIAGNOSIS — K5730 Diverticulosis of large intestine without perforation or abscess without bleeding: Secondary | ICD-10-CM | POA: Diagnosis not present

## 2013-06-12 DIAGNOSIS — Z803 Family history of malignant neoplasm of breast: Secondary | ICD-10-CM | POA: Diagnosis not present

## 2013-06-12 DIAGNOSIS — Z800 Family history of malignant neoplasm of digestive organs: Secondary | ICD-10-CM | POA: Diagnosis not present

## 2013-06-12 DIAGNOSIS — D126 Benign neoplasm of colon, unspecified: Secondary | ICD-10-CM | POA: Diagnosis not present

## 2013-07-04 DIAGNOSIS — Z23 Encounter for immunization: Secondary | ICD-10-CM | POA: Diagnosis not present

## 2013-07-05 DIAGNOSIS — H2700 Aphakia, unspecified eye: Secondary | ICD-10-CM | POA: Diagnosis not present

## 2013-08-12 DIAGNOSIS — G40909 Epilepsy, unspecified, not intractable, without status epilepticus: Secondary | ICD-10-CM | POA: Diagnosis not present

## 2013-08-12 DIAGNOSIS — E782 Mixed hyperlipidemia: Secondary | ICD-10-CM | POA: Diagnosis not present

## 2013-08-12 DIAGNOSIS — Z79899 Other long term (current) drug therapy: Secondary | ICD-10-CM | POA: Diagnosis not present

## 2013-08-12 DIAGNOSIS — D631 Anemia in chronic kidney disease: Secondary | ICD-10-CM | POA: Diagnosis not present

## 2013-08-12 DIAGNOSIS — E559 Vitamin D deficiency, unspecified: Secondary | ICD-10-CM | POA: Diagnosis not present

## 2013-09-04 DIAGNOSIS — Z96659 Presence of unspecified artificial knee joint: Secondary | ICD-10-CM | POA: Diagnosis not present

## 2013-09-10 DIAGNOSIS — L301 Dyshidrosis [pompholyx]: Secondary | ICD-10-CM | POA: Diagnosis not present

## 2014-01-10 DIAGNOSIS — J019 Acute sinusitis, unspecified: Secondary | ICD-10-CM | POA: Diagnosis not present

## 2014-01-10 DIAGNOSIS — H66009 Acute suppurative otitis media without spontaneous rupture of ear drum, unspecified ear: Secondary | ICD-10-CM | POA: Diagnosis not present

## 2014-01-10 DIAGNOSIS — J020 Streptococcal pharyngitis: Secondary | ICD-10-CM | POA: Diagnosis not present

## 2014-01-31 DIAGNOSIS — R059 Cough, unspecified: Secondary | ICD-10-CM | POA: Diagnosis not present

## 2014-01-31 DIAGNOSIS — B354 Tinea corporis: Secondary | ICD-10-CM | POA: Diagnosis not present

## 2014-01-31 DIAGNOSIS — J029 Acute pharyngitis, unspecified: Secondary | ICD-10-CM | POA: Diagnosis not present

## 2014-01-31 DIAGNOSIS — R05 Cough: Secondary | ICD-10-CM | POA: Diagnosis not present

## 2014-05-14 DIAGNOSIS — C50119 Malignant neoplasm of central portion of unspecified female breast: Secondary | ICD-10-CM | POA: Diagnosis not present

## 2014-05-14 DIAGNOSIS — R928 Other abnormal and inconclusive findings on diagnostic imaging of breast: Secondary | ICD-10-CM | POA: Diagnosis not present

## 2014-05-19 DIAGNOSIS — I872 Venous insufficiency (chronic) (peripheral): Secondary | ICD-10-CM | POA: Diagnosis not present

## 2014-05-20 DIAGNOSIS — Z09 Encounter for follow-up examination after completed treatment for conditions other than malignant neoplasm: Secondary | ICD-10-CM | POA: Diagnosis not present

## 2014-05-20 DIAGNOSIS — Z853 Personal history of malignant neoplasm of breast: Secondary | ICD-10-CM | POA: Diagnosis not present

## 2014-05-22 DIAGNOSIS — R609 Edema, unspecified: Secondary | ICD-10-CM | POA: Diagnosis not present

## 2014-05-28 DIAGNOSIS — J309 Allergic rhinitis, unspecified: Secondary | ICD-10-CM | POA: Diagnosis not present

## 2014-05-28 DIAGNOSIS — J020 Streptococcal pharyngitis: Secondary | ICD-10-CM | POA: Diagnosis not present

## 2014-05-28 DIAGNOSIS — J029 Acute pharyngitis, unspecified: Secondary | ICD-10-CM | POA: Diagnosis not present

## 2014-07-14 DIAGNOSIS — R42 Dizziness and giddiness: Secondary | ICD-10-CM | POA: Diagnosis not present

## 2014-07-14 DIAGNOSIS — E780 Pure hypercholesterolemia: Secondary | ICD-10-CM | POA: Diagnosis not present

## 2014-07-14 DIAGNOSIS — Z23 Encounter for immunization: Secondary | ICD-10-CM | POA: Diagnosis not present

## 2014-07-14 DIAGNOSIS — H6690 Otitis media, unspecified, unspecified ear: Secondary | ICD-10-CM | POA: Diagnosis not present

## 2014-07-14 DIAGNOSIS — Z833 Family history of diabetes mellitus: Secondary | ICD-10-CM | POA: Diagnosis not present

## 2014-07-16 DIAGNOSIS — H2703 Aphakia, bilateral: Secondary | ICD-10-CM | POA: Diagnosis not present

## 2014-11-13 DIAGNOSIS — J020 Streptococcal pharyngitis: Secondary | ICD-10-CM | POA: Diagnosis not present

## 2014-11-20 DIAGNOSIS — R569 Unspecified convulsions: Secondary | ICD-10-CM | POA: Diagnosis not present

## 2014-11-20 DIAGNOSIS — J40 Bronchitis, not specified as acute or chronic: Secondary | ICD-10-CM | POA: Diagnosis not present

## 2014-11-20 DIAGNOSIS — J9801 Acute bronchospasm: Secondary | ICD-10-CM | POA: Diagnosis not present

## 2015-01-28 DIAGNOSIS — H6690 Otitis media, unspecified, unspecified ear: Secondary | ICD-10-CM | POA: Diagnosis not present

## 2015-01-28 DIAGNOSIS — R52 Pain, unspecified: Secondary | ICD-10-CM | POA: Diagnosis not present

## 2015-01-28 DIAGNOSIS — R42 Dizziness and giddiness: Secondary | ICD-10-CM | POA: Diagnosis not present

## 2015-01-28 DIAGNOSIS — B354 Tinea corporis: Secondary | ICD-10-CM | POA: Diagnosis not present

## 2015-04-10 DIAGNOSIS — Z139 Encounter for screening, unspecified: Secondary | ICD-10-CM | POA: Diagnosis not present

## 2015-04-10 DIAGNOSIS — E780 Pure hypercholesterolemia: Secondary | ICD-10-CM | POA: Diagnosis not present

## 2015-04-10 DIAGNOSIS — R569 Unspecified convulsions: Secondary | ICD-10-CM | POA: Diagnosis not present

## 2015-05-20 DIAGNOSIS — R921 Mammographic calcification found on diagnostic imaging of breast: Secondary | ICD-10-CM | POA: Diagnosis not present

## 2015-05-20 DIAGNOSIS — C50112 Malignant neoplasm of central portion of left female breast: Secondary | ICD-10-CM | POA: Diagnosis not present

## 2015-07-20 DIAGNOSIS — Z23 Encounter for immunization: Secondary | ICD-10-CM | POA: Diagnosis not present

## 2015-07-20 DIAGNOSIS — H2703 Aphakia, bilateral: Secondary | ICD-10-CM | POA: Diagnosis not present

## 2015-07-21 DIAGNOSIS — I10 Essential (primary) hypertension: Secondary | ICD-10-CM | POA: Diagnosis not present

## 2015-07-21 DIAGNOSIS — J309 Allergic rhinitis, unspecified: Secondary | ICD-10-CM | POA: Diagnosis not present

## 2015-07-21 DIAGNOSIS — E7800 Pure hypercholesterolemia, unspecified: Secondary | ICD-10-CM | POA: Diagnosis not present

## 2015-07-21 DIAGNOSIS — Z79899 Other long term (current) drug therapy: Secondary | ICD-10-CM | POA: Diagnosis not present

## 2015-07-21 DIAGNOSIS — L989 Disorder of the skin and subcutaneous tissue, unspecified: Secondary | ICD-10-CM | POA: Diagnosis not present

## 2015-07-21 DIAGNOSIS — G4089 Other seizures: Secondary | ICD-10-CM | POA: Diagnosis not present

## 2015-08-05 DIAGNOSIS — L820 Inflamed seborrheic keratosis: Secondary | ICD-10-CM | POA: Diagnosis not present

## 2015-08-05 DIAGNOSIS — D485 Neoplasm of uncertain behavior of skin: Secondary | ICD-10-CM | POA: Diagnosis not present

## 2015-08-05 DIAGNOSIS — C44319 Basal cell carcinoma of skin of other parts of face: Secondary | ICD-10-CM | POA: Diagnosis not present

## 2015-09-02 DIAGNOSIS — C44319 Basal cell carcinoma of skin of other parts of face: Secondary | ICD-10-CM | POA: Diagnosis not present

## 2015-10-19 DIAGNOSIS — Z833 Family history of diabetes mellitus: Secondary | ICD-10-CM | POA: Diagnosis not present

## 2015-10-19 DIAGNOSIS — R569 Unspecified convulsions: Secondary | ICD-10-CM | POA: Diagnosis not present

## 2015-10-19 DIAGNOSIS — E7800 Pure hypercholesterolemia, unspecified: Secondary | ICD-10-CM | POA: Diagnosis not present

## 2015-10-19 DIAGNOSIS — I10 Essential (primary) hypertension: Secondary | ICD-10-CM | POA: Diagnosis not present

## 2015-10-19 DIAGNOSIS — Z139 Encounter for screening, unspecified: Secondary | ICD-10-CM | POA: Diagnosis not present

## 2015-10-19 DIAGNOSIS — Z760 Encounter for issue of repeat prescription: Secondary | ICD-10-CM | POA: Diagnosis not present

## 2015-10-19 DIAGNOSIS — Z0000 Encounter for general adult medical examination without abnormal findings: Secondary | ICD-10-CM | POA: Diagnosis not present

## 2015-10-28 DIAGNOSIS — R52 Pain, unspecified: Secondary | ICD-10-CM | POA: Diagnosis not present

## 2015-10-28 DIAGNOSIS — M25512 Pain in left shoulder: Secondary | ICD-10-CM | POA: Diagnosis not present

## 2015-10-28 DIAGNOSIS — M719 Bursopathy, unspecified: Secondary | ICD-10-CM | POA: Diagnosis not present

## 2015-11-26 DIAGNOSIS — R928 Other abnormal and inconclusive findings on diagnostic imaging of breast: Secondary | ICD-10-CM | POA: Diagnosis not present

## 2015-12-01 DIAGNOSIS — C4331 Malignant melanoma of nose: Secondary | ICD-10-CM | POA: Diagnosis not present

## 2015-12-01 DIAGNOSIS — Z853 Personal history of malignant neoplasm of breast: Secondary | ICD-10-CM | POA: Diagnosis not present

## 2015-12-01 DIAGNOSIS — C4321 Malignant melanoma of right ear and external auricular canal: Secondary | ICD-10-CM | POA: Diagnosis not present

## 2015-12-01 DIAGNOSIS — Z86000 Personal history of in-situ neoplasm of breast: Secondary | ICD-10-CM | POA: Diagnosis not present

## 2015-12-01 DIAGNOSIS — Z85820 Personal history of malignant melanoma of skin: Secondary | ICD-10-CM | POA: Diagnosis not present

## 2015-12-21 DIAGNOSIS — L821 Other seborrheic keratosis: Secondary | ICD-10-CM | POA: Diagnosis not present

## 2015-12-21 DIAGNOSIS — L570 Actinic keratosis: Secondary | ICD-10-CM | POA: Diagnosis not present

## 2016-01-18 DIAGNOSIS — Z139 Encounter for screening, unspecified: Secondary | ICD-10-CM | POA: Diagnosis not present

## 2016-01-18 DIAGNOSIS — I10 Essential (primary) hypertension: Secondary | ICD-10-CM | POA: Diagnosis not present

## 2016-01-18 DIAGNOSIS — E7800 Pure hypercholesterolemia, unspecified: Secondary | ICD-10-CM | POA: Diagnosis not present

## 2016-01-19 DIAGNOSIS — Z1211 Encounter for screening for malignant neoplasm of colon: Secondary | ICD-10-CM | POA: Diagnosis not present

## 2016-01-19 DIAGNOSIS — E7800 Pure hypercholesterolemia, unspecified: Secondary | ICD-10-CM | POA: Diagnosis not present

## 2016-01-19 DIAGNOSIS — G4089 Other seizures: Secondary | ICD-10-CM | POA: Diagnosis not present

## 2016-04-25 DIAGNOSIS — I10 Essential (primary) hypertension: Secondary | ICD-10-CM | POA: Diagnosis not present

## 2016-04-25 DIAGNOSIS — G40909 Epilepsy, unspecified, not intractable, without status epilepticus: Secondary | ICD-10-CM | POA: Diagnosis not present

## 2016-04-25 DIAGNOSIS — E7800 Pure hypercholesterolemia, unspecified: Secondary | ICD-10-CM | POA: Diagnosis not present

## 2016-04-25 DIAGNOSIS — Z139 Encounter for screening, unspecified: Secondary | ICD-10-CM | POA: Diagnosis not present

## 2016-04-26 DIAGNOSIS — H04123 Dry eye syndrome of bilateral lacrimal glands: Secondary | ICD-10-CM | POA: Diagnosis not present

## 2016-04-26 DIAGNOSIS — H26493 Other secondary cataract, bilateral: Secondary | ICD-10-CM | POA: Diagnosis not present

## 2016-05-03 DIAGNOSIS — R569 Unspecified convulsions: Secondary | ICD-10-CM | POA: Diagnosis not present

## 2016-05-03 DIAGNOSIS — E7800 Pure hypercholesterolemia, unspecified: Secondary | ICD-10-CM | POA: Diagnosis not present

## 2016-05-03 DIAGNOSIS — Z0000 Encounter for general adult medical examination without abnormal findings: Secondary | ICD-10-CM | POA: Diagnosis not present

## 2016-05-03 DIAGNOSIS — J309 Allergic rhinitis, unspecified: Secondary | ICD-10-CM | POA: Diagnosis not present

## 2016-05-26 DIAGNOSIS — R921 Mammographic calcification found on diagnostic imaging of breast: Secondary | ICD-10-CM | POA: Diagnosis not present

## 2016-05-30 DIAGNOSIS — C4339 Malignant melanoma of other parts of face: Secondary | ICD-10-CM | POA: Diagnosis not present

## 2016-05-30 DIAGNOSIS — R921 Mammographic calcification found on diagnostic imaging of breast: Secondary | ICD-10-CM | POA: Diagnosis not present

## 2016-05-30 DIAGNOSIS — Z86000 Personal history of in-situ neoplasm of breast: Secondary | ICD-10-CM | POA: Diagnosis not present

## 2016-06-08 DIAGNOSIS — N641 Fat necrosis of breast: Secondary | ICD-10-CM | POA: Diagnosis not present

## 2016-06-08 DIAGNOSIS — D0592 Unspecified type of carcinoma in situ of left breast: Secondary | ICD-10-CM | POA: Diagnosis not present

## 2016-06-08 DIAGNOSIS — D0512 Intraductal carcinoma in situ of left breast: Secondary | ICD-10-CM | POA: Diagnosis not present

## 2016-06-08 DIAGNOSIS — R921 Mammographic calcification found on diagnostic imaging of breast: Secondary | ICD-10-CM | POA: Diagnosis not present

## 2016-06-13 DIAGNOSIS — C4339 Malignant melanoma of other parts of face: Secondary | ICD-10-CM | POA: Diagnosis not present

## 2016-06-13 DIAGNOSIS — K5900 Constipation, unspecified: Secondary | ICD-10-CM

## 2016-06-13 DIAGNOSIS — Z85820 Personal history of malignant melanoma of skin: Secondary | ICD-10-CM | POA: Diagnosis not present

## 2016-06-13 DIAGNOSIS — D0512 Intraductal carcinoma in situ of left breast: Secondary | ICD-10-CM | POA: Diagnosis not present

## 2016-06-21 DIAGNOSIS — D499 Neoplasm of unspecified behavior of unspecified site: Secondary | ICD-10-CM | POA: Diagnosis not present

## 2016-06-21 DIAGNOSIS — D0512 Intraductal carcinoma in situ of left breast: Secondary | ICD-10-CM | POA: Diagnosis not present

## 2016-06-21 DIAGNOSIS — Z170 Estrogen receptor positive status [ER+]: Secondary | ICD-10-CM | POA: Diagnosis not present

## 2016-06-24 DIAGNOSIS — Z01818 Encounter for other preprocedural examination: Secondary | ICD-10-CM | POA: Diagnosis not present

## 2016-06-24 DIAGNOSIS — Z170 Estrogen receptor positive status [ER+]: Secondary | ICD-10-CM | POA: Diagnosis not present

## 2016-06-24 DIAGNOSIS — D0512 Intraductal carcinoma in situ of left breast: Secondary | ICD-10-CM | POA: Diagnosis not present

## 2016-06-24 DIAGNOSIS — Z23 Encounter for immunization: Secondary | ICD-10-CM | POA: Diagnosis not present

## 2016-06-24 DIAGNOSIS — E669 Obesity, unspecified: Secondary | ICD-10-CM | POA: Diagnosis not present

## 2016-06-24 DIAGNOSIS — C50912 Malignant neoplasm of unspecified site of left female breast: Secondary | ICD-10-CM | POA: Diagnosis not present

## 2016-06-25 DIAGNOSIS — Z170 Estrogen receptor positive status [ER+]: Secondary | ICD-10-CM | POA: Diagnosis not present

## 2016-06-25 DIAGNOSIS — D0512 Intraductal carcinoma in situ of left breast: Secondary | ICD-10-CM | POA: Diagnosis not present

## 2016-06-25 DIAGNOSIS — E669 Obesity, unspecified: Secondary | ICD-10-CM | POA: Diagnosis not present

## 2016-06-25 DIAGNOSIS — Z23 Encounter for immunization: Secondary | ICD-10-CM | POA: Diagnosis not present

## 2016-08-05 DIAGNOSIS — Z85820 Personal history of malignant melanoma of skin: Secondary | ICD-10-CM | POA: Diagnosis not present

## 2016-08-05 DIAGNOSIS — Z86000 Personal history of in-situ neoplasm of breast: Secondary | ICD-10-CM | POA: Diagnosis not present

## 2016-08-05 DIAGNOSIS — C439 Malignant melanoma of skin, unspecified: Secondary | ICD-10-CM | POA: Diagnosis not present

## 2016-08-05 DIAGNOSIS — D0512 Intraductal carcinoma in situ of left breast: Secondary | ICD-10-CM | POA: Diagnosis not present

## 2016-08-10 DIAGNOSIS — H2703 Aphakia, bilateral: Secondary | ICD-10-CM | POA: Diagnosis not present

## 2016-08-10 DIAGNOSIS — H16223 Keratoconjunctivitis sicca, not specified as Sjogren's, bilateral: Secondary | ICD-10-CM | POA: Diagnosis not present

## 2016-08-23 DIAGNOSIS — H2703 Aphakia, bilateral: Secondary | ICD-10-CM | POA: Diagnosis not present

## 2016-08-23 DIAGNOSIS — H16223 Keratoconjunctivitis sicca, not specified as Sjogren's, bilateral: Secondary | ICD-10-CM | POA: Diagnosis not present

## 2016-11-04 DIAGNOSIS — D0512 Intraductal carcinoma in situ of left breast: Secondary | ICD-10-CM | POA: Diagnosis not present

## 2017-01-09 DIAGNOSIS — D122 Benign neoplasm of ascending colon: Secondary | ICD-10-CM | POA: Diagnosis not present

## 2017-01-09 DIAGNOSIS — Z800 Family history of malignant neoplasm of digestive organs: Secondary | ICD-10-CM | POA: Diagnosis not present

## 2017-01-09 DIAGNOSIS — Z1211 Encounter for screening for malignant neoplasm of colon: Secondary | ICD-10-CM | POA: Diagnosis not present

## 2017-01-09 DIAGNOSIS — C183 Malignant neoplasm of hepatic flexure: Secondary | ICD-10-CM | POA: Diagnosis not present

## 2017-01-09 DIAGNOSIS — K635 Polyp of colon: Secondary | ICD-10-CM | POA: Diagnosis not present

## 2017-01-09 DIAGNOSIS — D123 Benign neoplasm of transverse colon: Secondary | ICD-10-CM | POA: Diagnosis not present

## 2017-01-09 DIAGNOSIS — K639 Disease of intestine, unspecified: Secondary | ICD-10-CM | POA: Diagnosis not present

## 2017-01-09 DIAGNOSIS — K5730 Diverticulosis of large intestine without perforation or abscess without bleeding: Secondary | ICD-10-CM | POA: Diagnosis not present

## 2017-01-19 DIAGNOSIS — C182 Malignant neoplasm of ascending colon: Secondary | ICD-10-CM | POA: Diagnosis not present

## 2017-01-19 DIAGNOSIS — C183 Malignant neoplasm of hepatic flexure: Secondary | ICD-10-CM | POA: Diagnosis not present

## 2017-03-09 DIAGNOSIS — C183 Malignant neoplasm of hepatic flexure: Secondary | ICD-10-CM | POA: Diagnosis not present

## 2017-03-09 DIAGNOSIS — Z01818 Encounter for other preprocedural examination: Secondary | ICD-10-CM | POA: Diagnosis not present

## 2017-03-13 DIAGNOSIS — K625 Hemorrhage of anus and rectum: Secondary | ICD-10-CM | POA: Diagnosis not present

## 2017-03-13 DIAGNOSIS — Z9012 Acquired absence of left breast and nipple: Secondary | ICD-10-CM | POA: Diagnosis not present

## 2017-03-13 DIAGNOSIS — Z888 Allergy status to other drugs, medicaments and biological substances status: Secondary | ICD-10-CM | POA: Diagnosis not present

## 2017-03-13 DIAGNOSIS — Z881 Allergy status to other antibiotic agents status: Secondary | ICD-10-CM | POA: Diagnosis not present

## 2017-03-13 DIAGNOSIS — E7800 Pure hypercholesterolemia, unspecified: Secondary | ICD-10-CM | POA: Diagnosis present

## 2017-03-13 DIAGNOSIS — G40909 Epilepsy, unspecified, not intractable, without status epilepticus: Secondary | ICD-10-CM | POA: Diagnosis present

## 2017-03-13 DIAGNOSIS — I872 Venous insufficiency (chronic) (peripheral): Secondary | ICD-10-CM | POA: Diagnosis present

## 2017-03-13 DIAGNOSIS — Z85828 Personal history of other malignant neoplasm of skin: Secondary | ICD-10-CM | POA: Diagnosis not present

## 2017-03-13 DIAGNOSIS — C182 Malignant neoplasm of ascending colon: Secondary | ICD-10-CM | POA: Diagnosis present

## 2017-03-13 DIAGNOSIS — Z7982 Long term (current) use of aspirin: Secondary | ICD-10-CM | POA: Diagnosis not present

## 2017-03-13 DIAGNOSIS — Z853 Personal history of malignant neoplasm of breast: Secondary | ICD-10-CM | POA: Diagnosis not present

## 2017-03-13 DIAGNOSIS — Z22321 Carrier or suspected carrier of Methicillin susceptible Staphylococcus aureus: Secondary | ICD-10-CM | POA: Diagnosis not present

## 2017-03-13 DIAGNOSIS — Z79899 Other long term (current) drug therapy: Secondary | ICD-10-CM | POA: Diagnosis not present

## 2017-03-13 DIAGNOSIS — E559 Vitamin D deficiency, unspecified: Secondary | ICD-10-CM | POA: Diagnosis present

## 2017-03-13 DIAGNOSIS — C183 Malignant neoplasm of hepatic flexure: Secondary | ICD-10-CM | POA: Diagnosis present

## 2017-03-13 DIAGNOSIS — M179 Osteoarthritis of knee, unspecified: Secondary | ICD-10-CM | POA: Diagnosis present

## 2017-03-13 DIAGNOSIS — Z800 Family history of malignant neoplasm of digestive organs: Secondary | ICD-10-CM | POA: Diagnosis not present

## 2017-03-23 DIAGNOSIS — Z09 Encounter for follow-up examination after completed treatment for conditions other than malignant neoplasm: Secondary | ICD-10-CM | POA: Diagnosis not present

## 2017-04-11 DIAGNOSIS — L989 Disorder of the skin and subcutaneous tissue, unspecified: Secondary | ICD-10-CM | POA: Diagnosis not present

## 2017-05-04 DIAGNOSIS — Z923 Personal history of irradiation: Secondary | ICD-10-CM | POA: Diagnosis not present

## 2017-05-04 DIAGNOSIS — Z85038 Personal history of other malignant neoplasm of large intestine: Secondary | ICD-10-CM | POA: Diagnosis not present

## 2017-05-04 DIAGNOSIS — D0512 Intraductal carcinoma in situ of left breast: Secondary | ICD-10-CM | POA: Diagnosis not present

## 2017-05-04 DIAGNOSIS — Z170 Estrogen receptor positive status [ER+]: Secondary | ICD-10-CM | POA: Diagnosis not present

## 2017-05-04 DIAGNOSIS — Z85820 Personal history of malignant melanoma of skin: Secondary | ICD-10-CM | POA: Diagnosis not present

## 2017-05-04 DIAGNOSIS — Z86000 Personal history of in-situ neoplasm of breast: Secondary | ICD-10-CM | POA: Diagnosis not present

## 2017-06-02 DIAGNOSIS — Z1231 Encounter for screening mammogram for malignant neoplasm of breast: Secondary | ICD-10-CM | POA: Diagnosis not present

## 2017-06-12 DIAGNOSIS — R300 Dysuria: Secondary | ICD-10-CM | POA: Diagnosis not present

## 2017-06-19 DIAGNOSIS — D0512 Intraductal carcinoma in situ of left breast: Secondary | ICD-10-CM | POA: Diagnosis not present

## 2017-06-19 DIAGNOSIS — Z170 Estrogen receptor positive status [ER+]: Secondary | ICD-10-CM | POA: Diagnosis not present

## 2017-06-19 DIAGNOSIS — Z6834 Body mass index (BMI) 34.0-34.9, adult: Secondary | ICD-10-CM | POA: Diagnosis not present

## 2017-06-19 DIAGNOSIS — D499 Neoplasm of unspecified behavior of unspecified site: Secondary | ICD-10-CM | POA: Diagnosis not present

## 2017-08-03 DIAGNOSIS — Z23 Encounter for immunization: Secondary | ICD-10-CM | POA: Diagnosis not present

## 2017-10-09 DIAGNOSIS — H16223 Keratoconjunctivitis sicca, not specified as Sjogren's, bilateral: Secondary | ICD-10-CM | POA: Diagnosis not present

## 2017-10-09 DIAGNOSIS — H2703 Aphakia, bilateral: Secondary | ICD-10-CM | POA: Diagnosis not present

## 2017-10-16 DIAGNOSIS — N3000 Acute cystitis without hematuria: Secondary | ICD-10-CM | POA: Diagnosis not present

## 2017-10-16 DIAGNOSIS — M545 Low back pain: Secondary | ICD-10-CM | POA: Diagnosis not present

## 2017-10-16 DIAGNOSIS — E785 Hyperlipidemia, unspecified: Secondary | ICD-10-CM | POA: Diagnosis not present

## 2017-10-16 DIAGNOSIS — G40909 Epilepsy, unspecified, not intractable, without status epilepticus: Secondary | ICD-10-CM | POA: Diagnosis not present

## 2017-10-16 DIAGNOSIS — S59901A Unspecified injury of right elbow, initial encounter: Secondary | ICD-10-CM | POA: Diagnosis not present

## 2017-10-16 DIAGNOSIS — Z79899 Other long term (current) drug therapy: Secondary | ICD-10-CM | POA: Diagnosis not present

## 2017-10-23 DIAGNOSIS — H2703 Aphakia, bilateral: Secondary | ICD-10-CM | POA: Diagnosis not present

## 2017-10-23 DIAGNOSIS — H16223 Keratoconjunctivitis sicca, not specified as Sjogren's, bilateral: Secondary | ICD-10-CM | POA: Diagnosis not present

## 2017-11-17 DIAGNOSIS — Z79899 Other long term (current) drug therapy: Secondary | ICD-10-CM | POA: Diagnosis not present

## 2017-11-17 DIAGNOSIS — Z6833 Body mass index (BMI) 33.0-33.9, adult: Secondary | ICD-10-CM | POA: Diagnosis not present

## 2017-11-17 DIAGNOSIS — G40909 Epilepsy, unspecified, not intractable, without status epilepticus: Secondary | ICD-10-CM | POA: Diagnosis not present

## 2017-11-17 DIAGNOSIS — E785 Hyperlipidemia, unspecified: Secondary | ICD-10-CM | POA: Diagnosis not present

## 2017-11-30 DIAGNOSIS — G8911 Acute pain due to trauma: Secondary | ICD-10-CM | POA: Diagnosis not present

## 2017-11-30 DIAGNOSIS — S8001XA Contusion of right knee, initial encounter: Secondary | ICD-10-CM | POA: Diagnosis not present

## 2017-11-30 DIAGNOSIS — S50811A Abrasion of right forearm, initial encounter: Secondary | ICD-10-CM | POA: Diagnosis not present

## 2017-11-30 DIAGNOSIS — S8990XA Unspecified injury of unspecified lower leg, initial encounter: Secondary | ICD-10-CM | POA: Diagnosis not present

## 2017-11-30 DIAGNOSIS — S81811A Laceration without foreign body, right lower leg, initial encounter: Secondary | ICD-10-CM | POA: Diagnosis not present

## 2017-11-30 DIAGNOSIS — S81011A Laceration without foreign body, right knee, initial encounter: Secondary | ICD-10-CM | POA: Diagnosis not present

## 2017-11-30 DIAGNOSIS — S8011XA Contusion of right lower leg, initial encounter: Secondary | ICD-10-CM | POA: Diagnosis not present

## 2017-11-30 DIAGNOSIS — Z23 Encounter for immunization: Secondary | ICD-10-CM | POA: Diagnosis not present

## 2017-12-07 DIAGNOSIS — S81811D Laceration without foreign body, right lower leg, subsequent encounter: Secondary | ICD-10-CM | POA: Diagnosis not present

## 2017-12-07 DIAGNOSIS — Z6833 Body mass index (BMI) 33.0-33.9, adult: Secondary | ICD-10-CM | POA: Diagnosis not present

## 2017-12-07 DIAGNOSIS — S8010XA Contusion of unspecified lower leg, initial encounter: Secondary | ICD-10-CM | POA: Diagnosis not present

## 2017-12-11 DIAGNOSIS — Z6833 Body mass index (BMI) 33.0-33.9, adult: Secondary | ICD-10-CM | POA: Diagnosis not present

## 2017-12-11 DIAGNOSIS — Z1331 Encounter for screening for depression: Secondary | ICD-10-CM | POA: Diagnosis not present

## 2017-12-11 DIAGNOSIS — S81811D Laceration without foreign body, right lower leg, subsequent encounter: Secondary | ICD-10-CM | POA: Diagnosis not present

## 2017-12-14 DIAGNOSIS — S8010XA Contusion of unspecified lower leg, initial encounter: Secondary | ICD-10-CM | POA: Diagnosis not present

## 2017-12-14 DIAGNOSIS — L03115 Cellulitis of right lower limb: Secondary | ICD-10-CM | POA: Diagnosis not present

## 2017-12-14 DIAGNOSIS — B373 Candidiasis of vulva and vagina: Secondary | ICD-10-CM | POA: Diagnosis not present

## 2017-12-14 DIAGNOSIS — Z6833 Body mass index (BMI) 33.0-33.9, adult: Secondary | ICD-10-CM | POA: Diagnosis not present

## 2017-12-21 DIAGNOSIS — Z6833 Body mass index (BMI) 33.0-33.9, adult: Secondary | ICD-10-CM | POA: Diagnosis not present

## 2017-12-21 DIAGNOSIS — S81811D Laceration without foreign body, right lower leg, subsequent encounter: Secondary | ICD-10-CM | POA: Diagnosis not present

## 2017-12-28 DIAGNOSIS — Z6833 Body mass index (BMI) 33.0-33.9, adult: Secondary | ICD-10-CM | POA: Diagnosis not present

## 2017-12-28 DIAGNOSIS — S81811D Laceration without foreign body, right lower leg, subsequent encounter: Secondary | ICD-10-CM | POA: Diagnosis not present

## 2017-12-28 DIAGNOSIS — S8010XA Contusion of unspecified lower leg, initial encounter: Secondary | ICD-10-CM | POA: Diagnosis not present

## 2018-01-11 DIAGNOSIS — Z6833 Body mass index (BMI) 33.0-33.9, adult: Secondary | ICD-10-CM | POA: Diagnosis not present

## 2018-01-11 DIAGNOSIS — S81811D Laceration without foreign body, right lower leg, subsequent encounter: Secondary | ICD-10-CM | POA: Diagnosis not present

## 2018-01-11 DIAGNOSIS — S8010XA Contusion of unspecified lower leg, initial encounter: Secondary | ICD-10-CM | POA: Diagnosis not present

## 2018-02-08 DIAGNOSIS — H66009 Acute suppurative otitis media without spontaneous rupture of ear drum, unspecified ear: Secondary | ICD-10-CM | POA: Diagnosis not present

## 2018-02-08 DIAGNOSIS — R509 Fever, unspecified: Secondary | ICD-10-CM | POA: Diagnosis not present

## 2018-02-13 DIAGNOSIS — Z09 Encounter for follow-up examination after completed treatment for conditions other than malignant neoplasm: Secondary | ICD-10-CM | POA: Diagnosis not present

## 2018-02-13 DIAGNOSIS — H6983 Other specified disorders of Eustachian tube, bilateral: Secondary | ICD-10-CM | POA: Diagnosis not present

## 2018-02-13 DIAGNOSIS — W19XXXA Unspecified fall, initial encounter: Secondary | ICD-10-CM | POA: Diagnosis not present

## 2018-02-13 DIAGNOSIS — Z6834 Body mass index (BMI) 34.0-34.9, adult: Secondary | ICD-10-CM | POA: Diagnosis not present

## 2018-02-28 DIAGNOSIS — S8010XA Contusion of unspecified lower leg, initial encounter: Secondary | ICD-10-CM | POA: Diagnosis not present

## 2018-02-28 DIAGNOSIS — H109 Unspecified conjunctivitis: Secondary | ICD-10-CM | POA: Diagnosis not present

## 2018-02-28 DIAGNOSIS — J309 Allergic rhinitis, unspecified: Secondary | ICD-10-CM | POA: Diagnosis not present

## 2018-02-28 DIAGNOSIS — Z85038 Personal history of other malignant neoplasm of large intestine: Secondary | ICD-10-CM | POA: Diagnosis not present

## 2018-03-16 DIAGNOSIS — E785 Hyperlipidemia, unspecified: Secondary | ICD-10-CM | POA: Diagnosis not present

## 2018-03-16 DIAGNOSIS — Z79899 Other long term (current) drug therapy: Secondary | ICD-10-CM | POA: Diagnosis not present

## 2018-03-16 DIAGNOSIS — Z6833 Body mass index (BMI) 33.0-33.9, adult: Secondary | ICD-10-CM | POA: Diagnosis not present

## 2018-03-16 DIAGNOSIS — M542 Cervicalgia: Secondary | ICD-10-CM | POA: Diagnosis not present

## 2018-03-16 DIAGNOSIS — Z13820 Encounter for screening for osteoporosis: Secondary | ICD-10-CM | POA: Diagnosis not present

## 2018-03-16 DIAGNOSIS — Z0000 Encounter for general adult medical examination without abnormal findings: Secondary | ICD-10-CM | POA: Diagnosis not present

## 2018-03-19 DIAGNOSIS — D0512 Intraductal carcinoma in situ of left breast: Secondary | ICD-10-CM | POA: Diagnosis not present

## 2018-03-19 DIAGNOSIS — C183 Malignant neoplasm of hepatic flexure: Secondary | ICD-10-CM | POA: Diagnosis not present

## 2018-03-19 DIAGNOSIS — Z6834 Body mass index (BMI) 34.0-34.9, adult: Secondary | ICD-10-CM | POA: Diagnosis not present

## 2018-03-24 ENCOUNTER — Emergency Department (HOSPITAL_COMMUNITY): Payer: Medicare Other

## 2018-03-24 ENCOUNTER — Other Ambulatory Visit: Payer: Self-pay

## 2018-03-24 ENCOUNTER — Observation Stay (HOSPITAL_COMMUNITY)
Admission: EM | Admit: 2018-03-24 | Discharge: 2018-03-26 | Disposition: A | Discharge status: Home Health | Payer: Medicare Other | Attending: Internal Medicine | Admitting: Internal Medicine

## 2018-03-24 ENCOUNTER — Encounter (HOSPITAL_COMMUNITY): Payer: Self-pay | Admitting: Emergency Medicine

## 2018-03-24 DIAGNOSIS — S065XAA Traumatic subdural hemorrhage with loss of consciousness status unknown, initial encounter: Secondary | ICD-10-CM | POA: Diagnosis present

## 2018-03-24 DIAGNOSIS — I6200 Nontraumatic subdural hemorrhage, unspecified: Principal | ICD-10-CM | POA: Insufficient documentation

## 2018-03-24 DIAGNOSIS — Z85038 Personal history of other malignant neoplasm of large intestine: Secondary | ICD-10-CM | POA: Insufficient documentation

## 2018-03-24 DIAGNOSIS — S065X9A Traumatic subdural hemorrhage with loss of consciousness of unspecified duration, initial encounter: Secondary | ICD-10-CM

## 2018-03-24 DIAGNOSIS — Z87891 Personal history of nicotine dependence: Secondary | ICD-10-CM | POA: Diagnosis not present

## 2018-03-24 DIAGNOSIS — E785 Hyperlipidemia, unspecified: Secondary | ICD-10-CM | POA: Diagnosis not present

## 2018-03-24 DIAGNOSIS — G459 Transient cerebral ischemic attack, unspecified: Secondary | ICD-10-CM | POA: Diagnosis not present

## 2018-03-24 DIAGNOSIS — R29818 Other symptoms and signs involving the nervous system: Secondary | ICD-10-CM | POA: Diagnosis not present

## 2018-03-24 DIAGNOSIS — R001 Bradycardia, unspecified: Secondary | ICD-10-CM | POA: Diagnosis not present

## 2018-03-24 DIAGNOSIS — R41841 Cognitive communication deficit: Secondary | ICD-10-CM | POA: Insufficient documentation

## 2018-03-24 DIAGNOSIS — L8990 Pressure ulcer of unspecified site, unspecified stage: Secondary | ICD-10-CM

## 2018-03-24 DIAGNOSIS — R29810 Facial weakness: Secondary | ICD-10-CM | POA: Diagnosis present

## 2018-03-24 DIAGNOSIS — I10 Essential (primary) hypertension: Secondary | ICD-10-CM | POA: Diagnosis not present

## 2018-03-24 DIAGNOSIS — R202 Paresthesia of skin: Secondary | ICD-10-CM | POA: Diagnosis not present

## 2018-03-24 DIAGNOSIS — R569 Unspecified convulsions: Secondary | ICD-10-CM

## 2018-03-24 LAB — CBC
HEMATOCRIT: 42.6 % (ref 36.0–46.0)
Hemoglobin: 13.6 g/dL (ref 12.0–15.0)
MCH: 29.7 pg (ref 26.0–34.0)
MCHC: 31.9 g/dL (ref 30.0–36.0)
MCV: 93 fL (ref 78.0–100.0)
Platelets: 236 10*3/uL (ref 150–400)
RBC: 4.58 MIL/uL (ref 3.87–5.11)
RDW: 14.3 % (ref 11.5–15.5)
WBC: 8.4 10*3/uL (ref 4.0–10.5)

## 2018-03-24 LAB — RAPID URINE DRUG SCREEN, HOSP PERFORMED
Amphetamines: NOT DETECTED
Benzodiazepines: NOT DETECTED
Cocaine: NOT DETECTED
OPIATES: NOT DETECTED
TETRAHYDROCANNABINOL: NOT DETECTED

## 2018-03-24 LAB — COMPREHENSIVE METABOLIC PANEL
ALK PHOS: 86 U/L (ref 38–126)
ALT: 13 U/L — AB (ref 14–54)
AST: 18 U/L (ref 15–41)
Albumin: 3.4 g/dL — ABNORMAL LOW (ref 3.5–5.0)
Anion gap: 11 (ref 5–15)
BILIRUBIN TOTAL: 0.3 mg/dL (ref 0.3–1.2)
BUN: 15 mg/dL (ref 6–20)
CALCIUM: 9.1 mg/dL (ref 8.9–10.3)
CO2: 29 mmol/L (ref 22–32)
CREATININE: 0.84 mg/dL (ref 0.44–1.00)
Chloride: 100 mmol/L — ABNORMAL LOW (ref 101–111)
GFR calc non Af Amer: >60 mL/min (ref >60)
GLUCOSE: 115 mg/dL — AB (ref 65–99)
Potassium: 3.9 mmol/L (ref 3.5–5.1)
Sodium: 140 mmol/L (ref 135–145)
TOTAL PROTEIN: 6.8 g/dL (ref 6.5–8.1)

## 2018-03-24 LAB — URINALYSIS, ROUTINE W REFLEX MICROSCOPIC
BACTERIA UA: NONE SEEN
BILIRUBIN URINE: NEGATIVE
Glucose, UA: NEGATIVE mg/dL
HGB URINE DIPSTICK: NEGATIVE
KETONES UR: NEGATIVE mg/dL
NITRITE: NEGATIVE
PH: 6 (ref 5.0–8.0)
Protein, ur: NEGATIVE mg/dL
SPECIFIC GRAVITY, URINE: 1.013 (ref 1.005–1.030)

## 2018-03-24 LAB — PROTIME-INR
INR: 1.08
Prothrombin Time: 13.9 seconds (ref 11.4–15.2)

## 2018-03-24 LAB — DIFFERENTIAL
Abs Immature Granulocytes: 0 10*3/uL (ref 0.0–0.1)
Basophils Absolute: 0.1 10*3/uL (ref 0.0–0.1)
Basophils Relative: 1 %
Eosinophils Absolute: 0.9 10*3/uL — ABNORMAL HIGH (ref 0.0–0.7)
Eosinophils Relative: 11 %
Immature Granulocytes: 0 %
LYMPHS ABS: 2.3 10*3/uL (ref 0.7–4.0)
LYMPHS PCT: 27 %
MONO ABS: 0.7 10*3/uL (ref 0.1–1.0)
Monocytes Relative: 9 %
Neutro Abs: 4.4 10*3/uL (ref 1.7–7.7)
Neutrophils Relative %: 52 %

## 2018-03-24 LAB — APTT: aPTT: 41 seconds — ABNORMAL HIGH (ref 24–36)

## 2018-03-24 LAB — I-STAT CHEM 8, ED
BUN: 17 mg/dL (ref 6–20)
CALCIUM ION: 1.08 mmol/L — AB (ref 1.15–1.40)
Chloride: 103 mmol/L (ref 101–111)
Creatinine, Ser: 0.8 mg/dL (ref 0.44–1.00)
Glucose, Bld: 111 mg/dL — ABNORMAL HIGH (ref 65–99)
HCT: 39 % (ref 36.0–46.0)
HEMOGLOBIN: 13.3 g/dL (ref 12.0–15.0)
POTASSIUM: 3.9 mmol/L (ref 3.5–5.1)
Sodium: 141 mmol/L (ref 135–145)
TCO2: 29 mmol/L (ref 22–32)

## 2018-03-24 LAB — ETHANOL

## 2018-03-24 LAB — I-STAT TROPONIN, ED: Troponin i, poc: 0.02 ng/mL (ref 0.00–0.08)

## 2018-03-24 NOTE — ED Triage Notes (Signed)
Per [facility], Pt from home. Pt reports L hand tingling and L side facial droop tonight that lasted about 5 minutes. Symptoms resolved. Pt has complaints of neck pain due to a fall 2 weeks ago. VSS, pt alert and oriented at this time.

## 2018-03-24 NOTE — ED Notes (Signed)
Pt in CT.

## 2018-03-24 NOTE — ED Provider Notes (Signed)
Emergency Department Provider Note   I have reviewed the triage vital signs and the nursing notes.   HISTORY  Chief Complaint Numbness   HPI Emmakate Hypes is a 84 y.o. female with multiple medical problems documented below the presents the emergency department today with resolution of symptoms of facial droop and difficulty speaking with left hand numbness.  Patient states that she was department to her daughter and also she can feel the left side of her face "drawl down" which lasted about 5 minutes she had difficulty speaking during that time as she could not move that side of her mouth.  During that episode she also had paresthesias in her left fingers.  Once again EMS was called and symptoms resolved prior to their arrival and she is asymptomatic at this time.  No recent illnesses besides the ear infection has cleared up.  No other associated symptoms and is symptom-free at this time. No other associated or modifying symptoms.    Past Medical History:  Diagnosis Date  . Arthritis   . Cancer ([place])    OR COLON POLYP  . Hyperlipidemia   . Leg swelling   . Seizures ([place])   . Wears dentures   . Wears glasses     There are no active problems to display for this patient.   Past Surgical History:  Procedure Laterality Date  . ABDOMINAL HYSTERECTOMY  11/15/79    Current Outpatient Rx  . Order #: [id number] Class: Historical Med  . Order #: [id number] Class: Historical Med  . Order #: [id number] Class: Historical Med  . Order #: [id number] Class: Historical Med  . Order #: [id number] Class: Historical Med  . Order #: [id number] Class: Historical Med  . Order #: [id number] Class: Historical Med    Allergies Ciprofloxacin and Flagyl [metronidazole hcl]  Family History  Problem Relation Age of Onset  . Cancer Sister     Social History Social History   Tobacco Use  . Smoking status: Former Research scientist (life sciences)  . Smokeless tobacco: Never Used  Substance Use Topics  . Alcohol use: No  . Drug  use: No    Review of Systems  All other systems negative except as documented in the HPI. All pertinent positives and negatives as reviewed in the HPI. ____________________________________________   PHYSICAL EXAM:  VITAL SIGNS: ED Triage Vitals  Enc Vitals Group     BP 03/24/18 2129 (!) 149/62     Pulse Rate 03/24/18 2129 86     Resp 03/24/18 2129 16     Temp 03/24/18 2129 98 F (36.7 C)     Temp Source 03/24/18 2129 Oral     SpO2 03/24/18 2125 96 %     Weight 03/24/18 2131 195 lb (88.5 kg)     Height 03/24/18 2131 5' 3.5" (1.613 m)     Head Circumference --      Peak Flow --      Pain Score 03/24/18 2131 0     Pain Loc --      Pain Edu? --      Excl. in [place]? --     Constitutional: Alert and oriented. Well appearing and in no acute distress. Eyes: Conjunctivae are normal. PERRL. EOMI. Head: Atraumatic. Nose: No congestion/rhinnorhea. Mouth/Throat: Mucous membranes are moist.  Oropharynx non-erythematous. Neck: No stridor.  No meningeal signs.   Cardiovascular: Normal rate, regular rhythm. Good peripheral circulation. Grossly normal heart sounds.   Respiratory: Normal respiratory effort.  No retractions. Lungs CTAB. Gastrointestinal: Soft and nontender. No distention.  Musculoskeletal:  No lower extremity tenderness nor edema. No gross deformities of extremities. Neurologic:  Normal speech and language. No gross focal neurologic deficits are appreciated.  Skin:  Skin is warm, dry and intact. No rash noted.   ____________________________________________   LABS (all labs ordered are listed, but only abnormal results are displayed)  Labs Reviewed  APTT - Abnormal; Notable for the following components:      Result Value   aPTT 41 (*)    All other components within normal limits  DIFFERENTIAL - Abnormal; Notable for the following components:   Eosinophils Absolute 0.9 (*)    All other components within normal limits  COMPREHENSIVE METABOLIC PANEL - Abnormal; Notable  for the following components:   Chloride 100 (*)    Glucose, Bld 115 (*)    Albumin 3.4 (*)    ALT 13 (*)    All other components within normal limits  RAPID URINE DRUG SCREEN, HOSP PERFORMED - Abnormal; Notable for the following components:   Barbiturates   (*)    Value: Result not available. Reagent lot number recalled by manufacturer.   All other components within normal limits  URINALYSIS, ROUTINE W REFLEX MICROSCOPIC - Abnormal; Notable for the following components:   Leukocytes, UA TRACE (*)    All other components within normal limits  I-STAT CHEM 8, ED - Abnormal; Notable for the following components:   Glucose, Bld 111 (*)    Calcium, Ion 1.08 (*)    All other components within normal limits  ETHANOL  PROTIME-INR  CBC  I-STAT TROPONIN, ED   ____________________________________________  EKG   EKG Interpretation  Date/Time:    Ventricular Rate:    PR Interval:    QRS Duration:   QT Interval:    QTC Calculation:   R Axis:     Text Interpretation:         ____________________________________________  RADIOLOGY  Ct Head Wo Contrast  Result Date: 03/24/2018 CLINICAL DATA:  Left hand tingling and facial droop EXAM: CT HEAD WITHOUT CONTRAST TECHNIQUE: Contiguous axial images were obtained from the base of the skull through the vertex without intravenous contrast. COMPARISON:  None. FINDINGS: Brain: No acute territorial infarction or intracranial mass is visualized. Thin right frontal parietal mostly subacute appearing subdural hematoma measuring 6 mm maximum thickness on coronal views with small punctate foci of increased density likely foci of more acute hemorrhage. Small amount of acute hemorrhage along the septum pellucidum with probable loculated hemorrhage in the left frontal horn of the ventricle. No midline shift. Moderate atrophy. Prominent ventricles. Mild small vessel ischemic changes of the white matter Vascular: No hyperdense vessels.  Carotid vascular  calcification Skull: Normal. Negative for fracture or focal lesion. Sinuses/Orbits: Mild mucosal thickening in the maxillary and ethmoid sinuses. No acute orbital abnormality Other: None IMPRESSION: 1. Thin 6 mm right frontoparietal subdural hematoma, mostly subacute in appearance with small scattered foci of petechial more acute appearing hemorrhage. Small foci of hemorrhage along the septum lucent on with probable loculated small focus of hemorrhage within the left frontal horn of the ventricle. No midline shift. No mass effect. 2. Atrophy with mild small vessel ischemic changes of the white matter Critical Value/emergent results were called by telephone at the time of interpretation on 03/24/2018 at 11:16 pm to Dr. Merrily Pew , who verbally acknowledged these results. Electronically Signed   By: Donavan Foil M.D.   On: 03/24/2018 23:16    ____________________________________________   PROCEDURES  Procedure(s) performed:   Procedures  ____________________________________________   INITIAL IMPRESSION / ASSESSMENT AND PLAN / ED COURSE  Suspect likely TIA as cause for symptoms.  Symptoms slowly resolved now so not a candidate for TPA.  Never had a that this before and has a strong family history of strokes and multiple family members so will likely observe in the hospital for further work-up.  CT with evidence of subdural bleeding is subacute in nature.  Discussed case with neurology who states that that could be was causing it but could still be seizure or TIA as well and she would likely need a TIA work-up along with EEG.  He recommends admission to medicine.  Patient unassigned.   Pertinent labs & imaging results that were available during my care of the patient were reviewed by me and considered in my medical decision making (see chart for details).  ____________________________________________  FINAL CLINICAL IMPRESSION(S) / ED DIAGNOSES  Final diagnoses:  Subdural hematoma  ([place])     MEDICATIONS GIVEN DURING THIS VISIT:  Medications - No data to display   NEW OUTPATIENT MEDICATIONS STARTED DURING THIS VISIT:  New Prescriptions   No medications on file    Note:  This note was prepared with assistance of Dragon voice recognition software. Occasional wrong-word or sound-a-like substitutions may have occurred due to the inherent limitations of voice recognition software.   Merrily Pew, MD 03/24/18 [phone]

## 2018-03-24 NOTE — Consult Note (Signed)
NEURO HOSPITALIST CONSULT NOTE   Requestig physician: Dr. Dayna Barker  Reason for Consult: Transient lateralized tingling and facial droop  History obtained from:   Patient and Chart    HPI:                                                                                                                                          Lisa Phillips is an 84 y.o. female presenting via EMS with transient episode of left hand tingling and left sided facial droop tonight that lasted for about 5 minutes. Symptoms now resolved. She had a fall 2 weeks ago and is complaining of neck pain. CT head revealed a subacute right small subdural hematoma along the anterior convexity of the frontal lobe, in addition to a small amount of intraventricular blood.   Of note, the patient has a history of seizures. Home medications include phenobarbital 60 mg/ml injection. She also takes ASA at home.   CT head: 1.Thin 6 mm right frontoparietal subdural hematoma, mostly subacute in appearance with small scattered foci of petechial more acute appearing hemorrhage. Small foci of hemorrhage along the septum pellucidum on with probable loculated small focus of hemorrhage within the left frontal horn of the ventricle. No midline shift. No mass effect. 2. Atrophy with mild small vessel ischemic changes of the white matter  Past Medical History:  Diagnosis Date  . Arthritis   . Cancer ([place])    OR COLON POLYP  . Hyperlipidemia   . Leg swelling   . Seizures ([place])   . Wears dentures   . Wears glasses     Past Surgical History:  Procedure Laterality Date  . ABDOMINAL HYSTERECTOMY  11/15/79    Family History  Problem Relation Age of Onset  . Cancer Sister               Social History:  reports that she has quit smoking. She has never used smokeless tobacco. She reports that she does not drink alcohol or use drugs.  Allergies  Allergen Reactions  . Ciprofloxacin Nausea Only  . Flagyl  [Metronidazole Hcl] Rash    All over the body.    MEDICATIONS:  Prior to Admission:  Medications Prior to Admission  Medication Sig Dispense Refill Last Dose  . aspirin EC 81 MG tablet Take 81 mg by mouth daily.   03/24/2018 at 0800  . cholecalciferol (VITAMIN D) 1000 UNITS tablet Take 1,000 Units by mouth daily.     03/24/2018 at Unknown time  . folic acid (FOLVITE) 161 MCG tablet Take 400 mcg by mouth daily.    03/24/2018 at Unknown time  . lovastatin (MEVACOR) 20 MG tablet Take 40 mg by mouth at bedtime.   03/23/2018  . Omega-3 Fatty Acids (FISH OIL) 1000 MG CAPS Take 1,000 mg by mouth 3 (three) times daily.    03/24/2018 at Unknown time  . PHENObarbital (LUMINAL) 60 MG tablet Take 60 mg by mouth 2 (two) times daily.   03/24/2018 at am   Scheduled: .  stroke: mapping our early stages of recovery book   Does not apply Once  . cholecalciferol  1,000 Units Oral Daily  . folic acid  1 mg Oral QODAY  . omega-3 acid ethyl esters  1 g Oral BID  . PHENobarbital  64.8 mg Oral BID  . pravastatin  40 mg Oral q1800   Continuous: . sodium chloride       ROS:                                                                                                                                       As per HPI.   Blood pressure (!) 149/62, pulse 86, temperature 98 F (36.7 C), temperature source Oral, resp. rate 16, height 5' 3.5" (1.613 m), weight 88.5 kg (195 lb), SpO2 97 %.   General Examination:                                                                                                       Physical Exam  HEENT-  [place]/AT   Lungs- Respirations unlabored Extremities- Warm and well perfused   Neurological Examination Mental Status: Alert, oriented, thought content appropriate.  Speech fluent without evidence of aphasia.  Able to follow all commands without difficulty. Cranial  Nerves: II: Visual fields grossly intact without extinction to DSS. PERRL.   III,IV, VI: Right ptosis noted; patient states this is chronic. EOMI without nystagmus.   V,VII: Smile symmetric, facial temp sensation equal bilaterally VIII: hearing intact to voice IX,X: No hypophonia XI: Symmetric XII: midline tongue extension Motor: RUE 4/5 deltoid, biceps and triceps with 5/5 grip. Patient states due to chronic  shoulder bursitis LUE: 5/5 Lower extremities: 4+/5 bilaterally Sensory: Temp and light touch intact x 4. No extinction Deep Tendon Reflexes:  2+ bilateral brachioradialis. 2+ left patella, absent right patellar and bilateral achilles Plantars: Equivocal bilaterally Cerebellar: No ataxia with FNF bilaterally Gait: Deferred   Lab Results: Basic Metabolic Panel: Recent Labs  Lab 03/24/18 2152 03/24/18 2208  NA 140 141  K 3.9 3.9  CL 100* 103  CO2 29  --   GLUCOSE 115* 111*  BUN 15 17  CREATININE 0.84 0.80  CALCIUM 9.1  --     CBC: Recent Labs  Lab 03/24/18 2152 03/24/18 2208  WBC 8.4  --   NEUTROABS 4.4  --   HGB 13.6 13.3  HCT 42.6 39.0  MCV 93.0  --   PLT 236  --     Cardiac Enzymes: No results for input(s): CKTOTAL, CKMB, CKMBINDEX, TROPONINI in the last 168 hours.  Lipid Panel: No results for input(s): CHOL, TRIG, HDL, CHOLHDL, VLDL, LDLCALC in the last 168 hours.  Imaging: Ct Head Wo Contrast  Result Date: 03/24/2018 CLINICAL DATA:  Left hand tingling and facial droop EXAM: CT HEAD WITHOUT CONTRAST TECHNIQUE: Contiguous axial images were obtained from the base of the skull through the vertex without intravenous contrast. COMPARISON:  None. FINDINGS: Brain: No acute territorial infarction or intracranial mass is visualized. Thin right frontal parietal mostly subacute appearing subdural hematoma measuring 6 mm maximum thickness on coronal views with small punctate foci of increased density likely foci of more acute hemorrhage. Small amount of acute  hemorrhage along the septum pellucidum with probable loculated hemorrhage in the left frontal horn of the ventricle. No midline shift. Moderate atrophy. Prominent ventricles. Mild small vessel ischemic changes of the white matter Vascular: No hyperdense vessels.  Carotid vascular calcification Skull: Normal. Negative for fracture or focal lesion. Sinuses/Orbits: Mild mucosal thickening in the maxillary and ethmoid sinuses. No acute orbital abnormality Other: None IMPRESSION: 1. Thin 6 mm right frontoparietal subdural hematoma, mostly subacute in appearance with small scattered foci of petechial more acute appearing hemorrhage. Small foci of hemorrhage along the septum lucent on with probable loculated small focus of hemorrhage within the left frontal horn of the ventricle. No midline shift. No mass effect. 2. Atrophy with mild small vessel ischemic changes of the white matter Critical Value/emergent results were called by telephone at the time of interpretation on 03/24/2018 at 11:16 pm to Dr. Merrily Pew , who verbally acknowledged these results. Electronically Signed   By: Donavan Foil M.D.   On: 03/24/2018 23:16    Assessment:  84 year old female with transient left hand tingling and left sided facial droop  1. DDx for transient neurological symptoms includes cortical irritation from adjacent subdural, simple partial seizure and TIA.  2. History of seizures. Treated with Luminal for this. Last seizure was 30 years ago, per patient.   Recommendations: 1. Discontinue ASA 2. Pharmacy to determine dose of her phenobarbital as the concentration but not the total dose is listed in Epic.   3. MRI brain, MRA head.  4. Carotid ultrasound 5. TTE 6. Cardiac telemetry.  7. PT/OT/Speech 8. EEG in AM.   Electronically signed: Dr. Kerney Elbe 03/24/2018, 11:45 PM

## 2018-03-25 ENCOUNTER — Observation Stay (HOSPITAL_COMMUNITY): Payer: Medicare Other

## 2018-03-25 ENCOUNTER — Encounter (HOSPITAL_COMMUNITY): Payer: Self-pay | Admitting: Family Medicine

## 2018-03-25 ENCOUNTER — Encounter (HOSPITAL_COMMUNITY): Payer: Medicare Other

## 2018-03-25 DIAGNOSIS — R29818 Other symptoms and signs involving the nervous system: Secondary | ICD-10-CM | POA: Diagnosis not present

## 2018-03-25 DIAGNOSIS — R569 Unspecified convulsions: Secondary | ICD-10-CM | POA: Diagnosis not present

## 2018-03-25 DIAGNOSIS — S065X9A Traumatic subdural hemorrhage with loss of consciousness of unspecified duration, initial encounter: Secondary | ICD-10-CM | POA: Diagnosis not present

## 2018-03-25 DIAGNOSIS — I6200 Nontraumatic subdural hemorrhage, unspecified: Secondary | ICD-10-CM | POA: Diagnosis not present

## 2018-03-25 DIAGNOSIS — I671 Cerebral aneurysm, nonruptured: Secondary | ICD-10-CM | POA: Diagnosis not present

## 2018-03-25 DIAGNOSIS — S065XAA Traumatic subdural hemorrhage with loss of consciousness status unknown, initial encounter: Secondary | ICD-10-CM | POA: Diagnosis present

## 2018-03-25 DIAGNOSIS — L8990 Pressure ulcer of unspecified site, unspecified stage: Secondary | ICD-10-CM

## 2018-03-25 LAB — LIPID PANEL
CHOL/HDL RATIO: 2.5 ratio
CHOLESTEROL: 211 mg/dL — AB (ref 0–200)
HDL: 84 mg/dL (ref >40)
LDL Cholesterol: 105 mg/dL — ABNORMAL HIGH (ref 0–99)
TRIGLYCERIDES: 110 mg/dL (ref <150)
VLDL: 22 mg/dL (ref 0–40)

## 2018-03-25 LAB — HEMOGLOBIN A1C
HEMOGLOBIN A1C: 5.6 % (ref 4.8–5.6)
MEAN PLASMA GLUCOSE: 114.02 mg/dL

## 2018-03-25 MED ORDER — ASPIRIN EC 81 MG PO TBEC
81.0000 mg | DELAYED_RELEASE_TABLET | Freq: Every day | ORAL | Status: DC
  Administered 2018-03-25 – 2018-03-26 (×2): 81 mg via ORAL
  Filled 2018-03-25 (×2): qty 1

## 2018-03-25 MED ORDER — ACETAMINOPHEN 325 MG PO TABS: 650.0000 mg | ORAL_TABLET | ORAL | Status: DC | PRN

## 2018-03-25 MED ORDER — SODIUM CHLORIDE 0.9 % IV SOLN
INTRAVENOUS | Status: AC
  Administered 2018-03-25: 04:00:00 via INTRAVENOUS

## 2018-03-25 MED ORDER — CALCIUM CARBONATE ANTACID 500 MG PO CHEW
1.0000 | CHEWABLE_TABLET | Freq: Three times a day (TID) | ORAL | Status: DC | PRN
  Administered 2018-03-25: 200 mg via ORAL
  Filled 2018-03-25: qty 1

## 2018-03-25 MED ORDER — GADOBENATE DIMEGLUMINE 529 MG/ML IV SOLN
17.0000 mL | Freq: Once | INTRAVENOUS | Status: AC
  Administered 2018-03-25: 17 mL via INTRAVENOUS

## 2018-03-25 MED ORDER — PHENOBARBITAL 32.4 MG PO TABS
64.8000 mg | ORAL_TABLET | Freq: Two times a day (BID) | ORAL | Status: DC
  Administered 2018-03-25 – 2018-03-26 (×3): 64.8 mg via ORAL
  Filled 2018-03-25 (×3): qty 2

## 2018-03-25 MED ORDER — PRAVASTATIN SODIUM 40 MG PO TABS
40.0000 mg | ORAL_TABLET | Freq: Every day | ORAL | Status: DC
  Administered 2018-03-25: 40 mg via ORAL
  Filled 2018-03-25: qty 1

## 2018-03-25 MED ORDER — VITAMIN D 1000 UNITS PO TABS
1000.0000 [IU] | ORAL_TABLET | Freq: Every day | ORAL | Status: DC
  Administered 2018-03-25 – 2018-03-26 (×2): 1000 [IU] via ORAL
  Filled 2018-03-25 (×2): qty 1

## 2018-03-25 MED ORDER — ACETAMINOPHEN 650 MG RE SUPP: 650.0000 mg | RECTAL | Status: DC | PRN

## 2018-03-25 MED ORDER — ACETAMINOPHEN 160 MG/5ML PO SOLN: 650.0000 mg | ORAL | Status: DC | PRN

## 2018-03-25 MED ORDER — OMEGA-3-ACID ETHYL ESTERS 1 G PO CAPS
1.0000 g | ORAL_CAPSULE | Freq: Two times a day (BID) | ORAL | Status: DC
  Administered 2018-03-25 – 2018-03-26 (×3): 1 g via ORAL
  Filled 2018-03-25 (×3): qty 1

## 2018-03-25 MED ORDER — SENNOSIDES-DOCUSATE SODIUM 8.6-50 MG PO TABS: 1.0000 | ORAL_TABLET | Freq: Every evening | ORAL | Status: DC | PRN

## 2018-03-25 MED ORDER — STROKE: EARLY STAGES OF RECOVERY BOOK
Freq: Once | Status: AC
  Administered 2018-03-25: 04:00:00
  Filled 2018-03-25: qty 1

## 2018-03-25 MED ORDER — FOLIC ACID 1 MG PO TABS
1.0000 mg | ORAL_TABLET | ORAL | Status: DC
  Administered 2018-03-25: 1 mg via ORAL
  Filled 2018-03-25 (×2): qty 1

## 2018-03-25 MED ORDER — HYDROCODONE-ACETAMINOPHEN 5-325 MG PO TABS: 1.0000 | ORAL_TABLET | ORAL | Status: DC | PRN

## 2018-03-25 NOTE — Progress Notes (Signed)
Patient arrived on the floor from the ED.  Vital signs stable and patient oriented to room.  All safety questions covered and call bell within pt's reach.

## 2018-03-25 NOTE — H&P (Signed)
History and Physical    Lisa Phillips YKD:[id number] DOB: 01/06/1931 DOA: 03/24/2018  PCP: Serita Grammes, MD   Patient coming from: Home   Chief Complaint: Left facial droop, left hand numbness  HPI: Lisa Phillips is a 84 y.o. female with medical history significant for cancer of the left breast 1 year ago status post mastectomy, and seizure disorder, now presenting to the emergency department after a transient episode involving left hand paresthesias and left facial droop with speech difficulty.  Patient reports that she had been in her usual state of health, was having an uneventful day, and was speaking with her daughter when she developed acute onset of left-sided facial droop with difficulty speaking and paresthesias in the left hand.  Symptoms resolved after approximately 5 minutes.  She denies experiencing similar symptoms previously.  Her last seizure was in 1974 and she continues on phenobarbital twice daily.  She reports difficulty with her balance for approximately 1 year, has fallen numerous times over this interval, reports hitting her head when she fell approximately 2 months ago, and again 2 weeks ago.  She did not lose consciousness with the fall, but reports that she did bump her head and suffered epistaxis.  ED Course: Upon arrival to the ED, patient is found to be afebrile, saturating well on room air, and with vitals otherwise normal.  EKG features sinus rhythm with significant artifact and nonspecific interventricular conduction delay.  Noncontrast head CT is notable for thin right frontoparietal subdural hematoma, mostly subacute in appearance, and small scattered foci of more acute appearing petechial hemorrhage without midline shift or mass-effect.  Chemistry panel and CBC are unremarkable, troponin is normal, UDS is negative, ethanol undetectable, and urinalysis unremarkable.  Neurology was consulted by the ED physician and recommended medical admission for further  evaluation.   Review of Systems:  All other systems reviewed and apart from HPI, are negative.  Past Medical History:  Diagnosis Date  . Arthritis   . Cancer ([place])    OR COLON POLYP  . Hyperlipidemia   . Leg swelling   . Seizures ([place])   . Wears dentures   . Wears glasses     Past Surgical History:  Procedure Laterality Date  . ABDOMINAL HYSTERECTOMY  11/15/79     reports that she has quit smoking. She has never used smokeless tobacco. She reports that she does not drink alcohol or use drugs.  Allergies  Allergen Reactions  . Ciprofloxacin Nausea Only  . Flagyl [Metronidazole Hcl] Rash    All over the body.    Family History  Problem Relation Age of Onset  . Cancer Sister      Prior to Admission medications   Medication Sig Start Date End Date Taking? Authorizing Provider  cholecalciferol (VITAMIN D) 1000 UNITS tablet Take 1,000 Units by mouth daily.      [provider]  folic acid (FOLVITE) 397 MCG tablet Take 400 mcg by mouth every other day.      [provider]  IBUPROFEN PO Take by mouth daily. Patient unsure of dosage at this time. Takes due to knee pain.     [provider]  LOVASTATIN PO Take 40 mg by mouth 2 (two) times daily.     [provider]  Omega-3 Fatty Acids (FISH OIL) 1000 MG CAPS Take by mouth 3 (three) times daily.      [provider]  phenobarbital (LUMINAL) 60 MG/ML injection      [provider]  Physical Exam: Vitals:   03/24/18 2125 03/24/18 2129 03/24/18 2131  BP:  (!) 149/62   Pulse:  86   Resp:  16   Temp:  98 F (36.7 C)   TempSrc:  Oral   SpO2: 96% 97%   Weight:   88.5 kg (195 lb)  Height:   5' 3.5" (1.613 m)      Constitutional: NAD, calm Eyes: PERTLA, lids and conjunctivae normal ENMT: Mucous membranes are moist. Posterior pharynx clear of any exudate or lesions.   Neck: normal, supple, no masses, no thyromegaly Respiratory: clear to auscultation bilaterally, no  wheezing, no crackles. Normal respiratory effort.   Cardiovascular: S1 & S2 heard, regular rate and rhythm. Trace LE edema bialterally. No significant JVD. Abdomen: No distension, no tenderness, soft. Bowel sounds normal.  Musculoskeletal: no clubbing / cyanosis. No joint deformity upper and lower extremities.    Skin: no significant rashes, lesions, ulcers. Warm, dry, well-perfused. Neurologic: CN 2-12 grossly intact. Sensation intact. Strength 5/5 in all 4 limbs.  Psychiatric: Alert and oriented x 3. Pleasant and cooperative.     Labs on Admission: I have personally reviewed following labs and imaging studies  CBC: Recent Labs  Lab 03/24/18 2152 03/24/18 2208  WBC 8.4  --   NEUTROABS 4.4  --   HGB 13.6 13.3  HCT 42.6 39.0  MCV 93.0  --   PLT 236  --    Basic Metabolic Panel: Recent Labs  Lab 03/24/18 2152 03/24/18 2208  NA 140 141  K 3.9 3.9  CL 100* 103  CO2 29  --   GLUCOSE 115* 111*  BUN 15 17  CREATININE 0.84 0.80  CALCIUM 9.1  --    GFR: Estimated Creatinine Clearance: 54.9 mL/min (by C-G formula based on SCr of 0.8 mg/dL). Liver Function Tests: Recent Labs  Lab 03/24/18 2152  AST 18  ALT 13*  ALKPHOS 86  BILITOT 0.3  PROT 6.8  ALBUMIN 3.4*   No results for input(s): LIPASE, AMYLASE in the last 168 hours. No results for input(s): AMMONIA in the last 168 hours. Coagulation Profile: Recent Labs  Lab 03/24/18 2152  INR 1.08   Cardiac Enzymes: No results for input(s): CKTOTAL, CKMB, CKMBINDEX, TROPONINI in the last 168 hours. BNP (last 3 results) No results for input(s): PROBNP in the last 8760 hours. HbA1C: No results for input(s): HGBA1C in the last 72 hours. CBG: No results for input(s): GLUCAP in the last 168 hours. Lipid Profile: No results for input(s): CHOL, HDL, LDLCALC, TRIG, CHOLHDL, LDLDIRECT in the last 72 hours. Thyroid Function Tests: No results for input(s): TSH, T4TOTAL, FREET4, T3FREE, THYROIDAB in the last 72 hours. Anemia  Panel: No results for input(s): VITAMINB12, FOLATE, FERRITIN, TIBC, IRON, RETICCTPCT in the last 72 hours. Urine analysis:    Component Value Date/Time   COLORURINE YELLOW 03/24/2018 2200   APPEARANCEUR CLEAR 03/24/2018 2200   LABSPEC 1.013 03/24/2018 2200   PHURINE 6.0 03/24/2018 2200   GLUCOSEU NEGATIVE 03/24/2018 2200   HGBUR NEGATIVE 03/24/2018 2200   BILIRUBINUR NEGATIVE 03/24/2018 2200   KETONESUR NEGATIVE 03/24/2018 2200   PROTEINUR NEGATIVE 03/24/2018 2200   NITRITE NEGATIVE 03/24/2018 2200   LEUKOCYTESUR TRACE (A) 03/24/2018 2200   Sepsis Labs: @LABRCNTIP (procalcitonin:4,lacticidven:4) )No results found for this or any previous visit (from the past 240 hour(s)).   Radiological Exams on Admission: Ct Head Wo Contrast  Result Date: 03/24/2018 CLINICAL DATA:  Left hand tingling and facial droop EXAM: CT HEAD WITHOUT CONTRAST TECHNIQUE: Contiguous axial  images were obtained from the base of the skull through the vertex without intravenous contrast. COMPARISON:  None. FINDINGS: Brain: No acute territorial infarction or intracranial mass is visualized. Thin right frontal parietal mostly subacute appearing subdural hematoma measuring 6 mm maximum thickness on coronal views with small punctate foci of increased density likely foci of more acute hemorrhage. Small amount of acute hemorrhage along the septum pellucidum with probable loculated hemorrhage in the left frontal horn of the ventricle. No midline shift. Moderate atrophy. Prominent ventricles. Mild small vessel ischemic changes of the white matter Vascular: No hyperdense vessels.  Carotid vascular calcification Skull: Normal. Negative for fracture or focal lesion. Sinuses/Orbits: Mild mucosal thickening in the maxillary and ethmoid sinuses. No acute orbital abnormality Other: None IMPRESSION: 1. Thin 6 mm right frontoparietal subdural hematoma, mostly subacute in appearance with small scattered foci of petechial more acute appearing  hemorrhage. Small foci of hemorrhage along the septum lucent on with probable loculated small focus of hemorrhage within the left frontal horn of the ventricle. No midline shift. No mass effect. 2. Atrophy with mild small vessel ischemic changes of the white matter Critical Value/emergent results were called by telephone at the time of interpretation on 03/24/2018 at 11:16 pm to Dr. Merrily Pew , who verbally acknowledged these results. Electronically Signed   By: Donavan Foil M.D.   On: 03/24/2018 23:16    EKG: Independently reviewed. Sinus rhythm, non-specific IVCD, artifact.   Assessment/Plan   1. Transient neuro deficits  - Presents following a ~5 minute episode marked by left lower facial weakness and left hand paraesthesia  - Presenting complaints had resolved prior to arrival  - CT head notable for subacute-appearing thin right frontoparietal SDH and petechial hemorrhages; there is no midline shift or mass-effect - Neurology is consulting and much appreciated  - Check MRI brain, MRA head, carotid dopplers, echo, and EEG   - Continue cardiac monitoring, neuro checks, PT/OT/SLP evals  - Stop ASA, continue statin   2. Seizure disorder  - Patient reports last seizure was in 1974  - Continue phenobarbital    DVT prophylaxis: SCD's  Code Status: Full  Family Communication: Discussed with patient  Consults called: Neurology  Admission status: Observation     Vianne Bulls, MD [facility] Pager [phone]  If 7PM-7AM, please contact night-coverage [email or web address] Password [facility]  03/25/2018, 12:43 AM

## 2018-03-25 NOTE — Care Management Note (Signed)
Case Management Note  Patient Details  Name: Lisa Phillips MRN: [id number] Date of Birth: 12-Oct-1930  Subjective/Objective:                 Spoke w patient at bedside. She states that she would like to use [facility] for [facility] services as she used them before with her husband and is familiar w them. No DME needed. Anticipate PT OT ST. Referral made to [facility], accepted. Needs HH orders.     Action/Plan:   Expected Discharge Date:                  Expected Discharge Plan:  [place]  In-House Referral:     Discharge planning Services  CM Consult  Post Acute Care Choice:  Home Health Choice offered to:  Patient  DME Arranged:    DME Agency:     HH Arranged:  PT, OT, Speech Therapy [place] Agency:  [place]  Status of Service:  Completed, signed off  If discussed at [place] of Stay Meetings, dates discussed:    Additional Comments:  Carles Collet, RN 03/25/2018, 2:38 PM

## 2018-03-25 NOTE — Evaluation (Signed)
Speech Language Pathology Evaluation Patient Details Name: Lisa Phillips MRN: [id number] DOB: 09-Apr-1931 Today's Date: 03/25/2018 Time: 65-1450 SLP Time Calculation (min) (ACUTE ONLY): 20 min  Problem List:  Patient Active Problem List   Diagnosis Date Noted  . Seizures ([place]) 03/25/2018  . Subdural hematoma ([place]) 03/25/2018  . Transient neurologic deficit 03/25/2018  . Pressure injury of skin 03/25/2018   Past Medical History:  Past Medical History:  Diagnosis Date  . Arthritis   . Cancer ([place])    OR COLON POLYP  . Hyperlipidemia   . Leg swelling   . Seizures ([place])   . Wears dentures   . Wears glasses    Past Surgical History:  Past Surgical History:  Procedure Laterality Date  . ABDOMINAL HYSTERECTOMY  11/15/79   HPI:  Lisa Phillips is a 84 y.o. female with medical history significant for cancer of the left breast cancer status post mastectomy, and seizure disorder, multiple falls with last one 2 weeks ago when out tending to her flowers. Now presenting to the emergency department after a transient episode involving left hand paresthesias and left facial droop with speech difficulty.  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 Pt scored 21/30 on MOCA-Basic (>26 is considered normal). Speech is fluent and without dysarthria, expressive and receptive language intact for mod complex conversation. Recommend skilled ST in [facility] to maximize cognition for improved  safety awareness, increased independence and to decrease caregiver burden. No further skilled ST needs identified. SLP will s/o.    SLP Assessment  SLP Recommendation/Assessment: All further Speech Lanaguage Pathology  needs can be addressed in the next venue of care SLP Visit Diagnosis: Cognitive communication deficit (R41.841)    Follow Up Recommendations  Home health SLP    Frequency and Duration           SLP Evaluation Cognition  Overall Cognitive Status: Within Functional Limits for tasks assessed Arousal/Alertness: Awake/alert Orientation Level: Oriented to person;Oriented to place;Disoriented to time;Oriented to situation Attention: Focused;Sustained;Selective;Alternating Focused Attention: Appears intact Sustained Attention: Appears intact Selective Attention: Appears intact Alternating Attention: Impaired Alternating Attention Impairment: Verbal basic;Functional basic Memory: Impaired Memory Impairment: Decreased recall of new information(delayed recall 3/5) Awareness: Impaired Awareness Impairment: Intellectual impairment Problem Solving: Impaired Problem Solving Impairment: Verbal basic;Functional basic Executive Function: Reasoning;Self Monitoring;Self Correcting Reasoning: Impaired Reasoning Impairment: Verbal basic Self Monitoring: Impaired Self Monitoring Impairment: Functional basic Self Correcting: Impaired Self Correcting Impairment: Functional basic Safety/Judgment: Impaired       Comprehension  Auditory Comprehension Overall Auditory Comprehension: Appears within functional limits for tasks assessed Yes/No Questions: Within Functional Limits Commands: Within Functional Limits Conversation: Other (comment)(mod complex) Visual Recognition/Discrimination Discrimination: Within Function Limits Reading Comprehension Reading Status: Not tested    Expression Expression Primary Mode of Expression: Verbal Verbal Expression Overall Verbal Expression:  Appears within functional limits for tasks assessed Initiation: No impairment Level of Generative/Spontaneous Verbalization: Conversation Repetition: No impairment Naming: No impairment Pragmatics: No impairment Written Expression Dominant Hand: Right Written Expression: Not tested   Oral / Motor  Oral Motor/Sensory Function Overall Oral Motor/Sensory Function: Within functional limits(r eye ptosis, reportedly baseline)  Motor Speech Overall Motor Speech: Appears within functional limits for tasks assessed Respiration: Within functional limits Phonation: Normal Resonance: Within functional limits Articulation: Within functional limitis Intelligibility: Intelligible Motor Planning: Witnin functional limits Motor Speech Errors: Not applicable   [place], [place], [place] Pathologist [phone]  DENAJAH FARIAS 03/25/2018, 2:56 PM

## 2018-03-25 NOTE — Progress Notes (Signed)
Pt had several surgical scars on body.  Bilateral knee replacement scars are visible as well as left breast masectomy with thick pink scarring around bottom area of breast.  Patient had a rash in between thighs and and bottom of belly, which we applied antifungal powder to after cleansing.  Pt has a stage 1 mid sacrum which barrier cream was applied and foam dressing placed.

## 2018-03-25 NOTE — Evaluation (Signed)
Occupational Therapy Evaluation Patient Details Name: Lisa Phillips MRN: [id number] DOB: 11/17/1930 Today's Date: 03/25/2018    History of Present Illness Lisa Phillips is a 84 y.o. female with medical history significant for cancer of the left breast cancer status post mastectomy, and seizure disorder, multiple falls with last one 2 weeks ago when out tending to her flowers. Now presenting to the emergency department after a transient episode involving left hand paresthesias and left facial droop with speech difficulty.  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 She will benefit from acute OT with follow up [place] to assess how pt is doing in her own environment.   Follow Up Recommendations  Home health OT;Supervision/Assistance - 24 hour    Equipment Recommendations  None recommended by OT       Precautions / Restrictions Precautions Precautions: Fall Precaution Comments: History of falls with last one 2 weeks ago Restrictions Weight Bearing Restrictions: No      Mobility Bed Mobility Overal bed mobility: Needs Assistance Bed Mobility: Sit to Supine       Sit to supine: Min assist;HOB elevated   General bed mobility comments: Pt reports this is a chore for her at home too  Transfers Overall transfer level: Needs assistance Equipment used: Rolling walker (2 wheeled) Transfers: Sit to/from Stand Sit to Stand: Min guard         General transfer comment: increased time    Balance Overall balance assessment: Needs assistance Sitting-balance support: No upper  extremity supported;Feet supported Sitting balance-Leahy Scale: Good     Standing balance support: No upper extremity supported;During functional activity Standing balance-Leahy Scale: Fair Standing balance comment: washing hands at sink                           ADL either performed or assessed with clinical judgement   ADL Overall ADL's : Needs assistance/impaired Eating/Feeding: Independent;Sitting   Grooming: Min guard;Standing   Upper Body Bathing: Set up   Lower Body Bathing: Min guard;Sit to/from stand   Upper Body Dressing : Set up;Sitting   Lower Body Dressing: Min guard;Sit to/from stand   Toilet Transfer: Min guard;Ambulation;RW;Regular Toilet;Grab bars   Toileting- Clothing Manipulation and Hygiene: Min guard;Sit to/from stand               Vision Baseline Vision/History: Wears glasses Wears Glasses: Reading only Patient Visual Report: No change from baseline Vision Assessment?: Yes Eye Alignment: Within Functional Limits Ocular Range of Motion: Within Functional Limits Alignment/Gaze Preference: Within Defined Limits Tracking/Visual Pursuits: Able to track stimulus in all quads without difficulty Convergence: Within functional limits Visual Fields: No apparent deficits Additional Comments: Has ptosis of right eye lid (pta)            Pertinent Vitals/Pain Pain Assessment: No/denies pain     Hand Dominance Right   Extremity/Trunk Assessment Upper Extremity Assessment Upper Extremity Assessment: Overall WFL for tasks assessed           Communication Communication Communication: No difficulties   Cognition Arousal/Alertness: Awake/alert Behavior During Therapy: WFL for tasks assessed/performed Overall Cognitive Status: Within Functional Limits for tasks assessed  General Comments: I did have to cue her to not put the rolling walker off to the side when she turned towards sink to  wash her hands. She then said that she normally does not use the RW in the house and when she does she reports she does put if off to the side at times--explained that it was safer to keep the RW in front of her for whatever she is doing              Home Living Family/patient expects to be discharged to:: Private residence Living Arrangements: Children(dtr who is a CNA, disabled) Available Help at Discharge: Family;Available 24 hours/day Type of Home: House Home Access: Level entry     Home Layout: Multi-level Alternate Level Stairs-Number of Steps: 6,7 (rail on right going up). Pt has to go to all 3 levels Alternate Level Stairs-Rails: Right Bathroom Shower/Tub: Walk-in shower;Curtain   Bathroom Toilet: Handicapped height     Home Equipment: Environmental consultant - 2 wheels;Cane - quad;Shower seat          Prior Functioning/Environment Level of Independence: Independent        Comments: Drives and does yard work, sometimes uses RW but not in house        OT Problem List: Impaired balance (sitting and/or standing);Decreased safety awareness      OT Treatment/Interventions: Self-care/ADL training;Balance training;DME and/or AE instruction;Patient/family education    OT Goals(Current goals can be found in the care plan section) Acute Rehab OT Goals Patient Stated Goal: to go home today  OT Frequency: Min 2X/week              AM-PAC PT "6 Clicks" Daily Activity     Outcome Measure Help from another person eating meals?: None Help from another person taking care of personal grooming?: A Little Help from another person toileting, which includes using toliet, bedpan, or urinal?: A Little Help from another person bathing (including washing, rinsing, drying)?: A Little Help from another person to put on and taking off regular upper body clothing?: A Little Help from another person to put on and taking off regular lower body clothing?: A Little 6 Click Score: 19   End of Session  Equipment Utilized During Treatment: Gait belt;Rolling walker Nurse Communication: Mobility status(urine has strong smell)  Activity Tolerance: Patient tolerated treatment well Patient left: in chair;with call bell/phone within reach;with chair alarm set  OT Visit Diagnosis: Unsteadiness on feet (R26.81);Repeated falls (R29.6);History of falling (Z91.81)                Time: 8469-6295 OT Time Calculation (min): 55 min Charges:  OT General Charges $OT Visit: 1 Visit OT Evaluation $OT Eval Moderate Complexity: 1 Mod OT Treatments $Self Care/Home Management : 38-52 mins [place], OTR/L [phone] 03/25/2018

## 2018-03-25 NOTE — Progress Notes (Signed)
PROGRESS NOTE    Lisa Phillips  KWI:[id number] DOB: [birth year] DOA: 03/24/2018 PCP: Serita Grammes, MD    Brief Narrative:  84 year old with past medical history relevant for breast cancer status post mastectomy approximately 1 years ago, seizure disorder on phenobarbital, hyperlipidemia admitted for left paresthesias and left facial droop and speech difficulty and found to have small frontoparietal subdural hematoma that appeared subacute, injure ventricular lesion, chronic intraventricular hemorrhage suggestive of recurrent subarachnoid hemorrhage.   Assessment & Plan:   Principal Problem:   Transient neurologic deficit Active Problems:   Seizures (HCC)   Subdural hematoma (HCC)   #) Transient neurological deficit due to subdural hematoma: Unclear etiology however currently no evidence of ischemic stroke.  Most likely cause would be possibly related to this right-sided subdural hematoma. - Hold aspirin 81 mg until outpatient -Outpatient repeat head CT imaging for stability -Physical therapy - Outpatient neurology follow-up - PT/OT evaluation  #) Intraventricular lesion complicated by hemorrhage: Unclear if this is particularly symptomatic. -Postcontrast MRI ordered -Outpatient follow-up  #) Seizures: - Continue phenobarbital 65 mg twice daily -EEG pending per neurology  #) Hyperlipidemia: -Continue pravastatin 40 mg nightly  Fluids: Gentle IV fluids Elect lites: Monitor and supplement Nutrition: Heart healthy diet  Prophylaxis: SCDs  Disposition: Pending EEG and postcontrast MRI  Full code  Consultants:   Neurology  Procedures:   EEG 03/25/2018: Pending  Antimicrobials:   None   Subjective: Patient reports she is doing fairly well.  She reports that her symptoms are otherwise completely resolved.  She denies any headache, nausea, vomiting, diarrhea.  She does not have any ophthalmologic symptoms.  She is ambulating with a walker in the  room.  Objective: Vitals:   03/25/18 0259 03/25/18 0459 03/25/18 0656 03/25/18 0848  BP: (!) 142/81 131/78 131/78 (!) 159/94  Pulse: 72 77 77 78  Resp: 16 16 16 12   Temp: 97.9 F (36.6 C) (!) 97.5 F (36.4 C) (!) 97.4 F (36.3 C) (!) 97.2 F (36.2 C)  TempSrc: Oral Oral Oral Oral  SpO2: 96% 95% 96% 95%  Weight:      Height:        Intake/Output Summary (Last 24 hours) at 03/25/2018 1008 Last data filed at 03/25/2018 0335 Gross per 24 hour  Intake 0.34 ml  Output -  Net 0.34 ml   Filed Weights   03/24/18 2131  Weight: 88.5 kg (195 lb)    Examination:  General exam: Appears calm and comfortable  Respiratory system: Clear to auscultation. Respiratory effort normal. Cardiovascular system: Regular rate and rhythm, no murmurs Gastrointestinal system: Abdomen is nondistended, soft and nontender. No organomegaly or masses felt. Normal bowel sounds heard. Central nervous system: Alert and oriented.  Cranial nerves II through XII intact, bilateral upper extremity and lower extremity strength 5 out of 5, gait appears to be shuffling, right upper extremity resting tremor Extremities: His lower extremity edema Skin: No rashes or visible skin Psychiatry: Judgement and insight appear normal. Mood & affect appropriate.     Data Reviewed: I have personally reviewed following labs and imaging studies  CBC: Recent Labs  Lab 03/24/18 2152 03/24/18 2208  WBC 8.4  --   NEUTROABS 4.4  --   HGB 13.6 13.3  HCT 42.6 39.0  MCV 93.0  --   PLT 236  --    Basic Metabolic Panel: Recent Labs  Lab 03/24/18 2152 03/24/18 2208  NA 140 141  K 3.9 3.9  CL 100* 103  CO2 29  --  GLUCOSE 115* 111*  BUN 15 17  CREATININE 0.84 0.80  CALCIUM 9.1  --    GFR: Estimated Creatinine Clearance: 54.9 mL/min (by C-G formula based on SCr of 0.8 mg/dL). Liver Function Tests: Recent Labs  Lab 03/24/18 2152  AST 18  ALT 13*  ALKPHOS 86  BILITOT 0.3  PROT 6.8  ALBUMIN 3.4*   No results  for input(s): LIPASE, AMYLASE in the last 168 hours. No results for input(s): AMMONIA in the last 168 hours. Coagulation Profile: Recent Labs  Lab 03/24/18 2152  INR 1.08   Cardiac Enzymes: No results for input(s): CKTOTAL, CKMB, CKMBINDEX, TROPONINI in the last 168 hours. BNP (last 3 results) No results for input(s): PROBNP in the last 8760 hours. HbA1C: Recent Labs    03/25/18 0837  HGBA1C 5.6   CBG: No results for input(s): GLUCAP in the last 168 hours. Lipid Profile: Recent Labs    03/25/18 0837  CHOL 211*  HDL 84  LDLCALC 105*  TRIG 110  CHOLHDL 2.5   Thyroid Function Tests: No results for input(s): TSH, T4TOTAL, FREET4, T3FREE, THYROIDAB in the last 72 hours. Anemia Panel: No results for input(s): VITAMINB12, FOLATE, FERRITIN, TIBC, IRON, RETICCTPCT in the last 72 hours. Sepsis Labs: No results for input(s): PROCALCITON, LATICACIDVEN in the last 168 hours.  No results found for this or any previous visit (from the past 240 hour(s)).       Radiology Studies: Ct Head Wo Contrast  Result Date: 03/24/2018 CLINICAL DATA:  Left hand tingling and facial droop EXAM: CT HEAD WITHOUT CONTRAST TECHNIQUE: Contiguous axial images were obtained from the base of the skull through the vertex without intravenous contrast. COMPARISON:  None. FINDINGS: Brain: No acute territorial infarction or intracranial mass is visualized. Thin right frontal parietal mostly subacute appearing subdural hematoma measuring 6 mm maximum thickness on coronal views with small punctate foci of increased density likely foci of more acute hemorrhage. Small amount of acute hemorrhage along the septum pellucidum with probable loculated hemorrhage in the left frontal horn of the ventricle. No midline shift. Moderate atrophy. Prominent ventricles. Mild small vessel ischemic changes of the white matter Vascular: No hyperdense vessels.  Carotid vascular calcification Skull: Normal. Negative for fracture or focal  lesion. Sinuses/Orbits: Mild mucosal thickening in the maxillary and ethmoid sinuses. No acute orbital abnormality Other: None IMPRESSION: 1. Thin 6 mm right frontoparietal subdural hematoma, mostly subacute in appearance with small scattered foci of petechial more acute appearing hemorrhage. Small foci of hemorrhage along the septum lucent on with probable loculated small focus of hemorrhage within the left frontal horn of the ventricle. No midline shift. No mass effect. 2. Atrophy with mild small vessel ischemic changes of the white matter Critical Value/emergent results were called by telephone at the time of interpretation on 03/24/2018 at 11:16 pm to Dr. Merrily Pew , who verbally acknowledged these results. Electronically Signed   By: Donavan Foil M.D.   On: 03/24/2018 23:16   Mr Brain Wo Contrast  Result Date: 03/25/2018 CLINICAL DATA:  Initial evaluation for left facial droop, left hand numbness. EXAM: MRI HEAD WITHOUT CONTRAST MRA HEAD WITHOUT CONTRAST TECHNIQUE: Multiplanar, multiecho pulse sequences of the brain and surrounding structures were obtained without intravenous contrast. Angiographic images of the head were obtained using MRA technique without contrast. COMPARISON:  Prior CT from 03/24/2018. FINDINGS: MRI HEAD FINDINGS Brain: Generalized age-related cerebral volume loss. Patchy and confluent T2/FLAIR hyperintensity within the periventricular and deep white matter both cerebral hemispheres, most likely related  chronic small vessel ischemic change, mild for age. No abnormal foci of restricted diffusion to suggest acute or subacute ischemia. Gray-white matter differentiation maintained. No areas of remote cortical infarction. Previously identified right holo hemispheric subacute subdural hematoma again seen. This measures up to 8 mm in maximal thickness. Probable small amount of subjacent subarachnoid blood within underlying right frontal cortical sulci on FLAIR sequence. Mild mass effect on  the subjacent right cerebral hemisphere without midline shift. Basilar cisterns remain patent. There is a round T2 hypointense, T1 hyperintense lesion positioned along the left aspect of the septum pellucidum measuring 9 x 7 x 7 mm (series 18, image 34). While this appeared as possible hemorrhage on prior CT, this appears more masslike on this examination. Lesion appears attached to the adjacent septum pellucidum by a small stalk. Finding could reflect an intraventricular neoplasm possibly a vascular lesion. Evidence of chronic intraventricular hemorrhage with susceptibility artifacts seen posteriorly within both lateral ventricles. No hydrocephalus. Superficial siderosis seen about the basilar cisterns as well as the cerebellar vermis and bilateral sylvian fissures, with extension around the brainstem and upper spinal cord. Finding suggests repeated chronic subarachnoid hemorrhage. No other mass lesion. Pituitary gland suprasellar region within normal limits. Midline structures intact. Vascular: Major intracranial vascular flow voids are maintained. Skull and upper cervical spine: Craniocervical junction within normal limits. Upper cervical spine normal. Bone marrow signal intensity normal. No scalp soft tissue abnormality. Sinuses/Orbits: Globes and orbital soft tissues demonstrate no acute finding. Patient status post ocular lens extraction bilaterally. Paranasal sinuses are largely clear. No mastoid effusion. Inner ear structures normal. Other: None. MRA HEAD FINDINGS ANTERIOR CIRCULATION: Distal cervical segments of the internal carotid arteries are patent with antegrade flow. Petrous, cavernous, and supraclinoid segments widely patent bilaterally. Small 3 mm focal outpouching extending from the cavernous right ICA, suspicious for small aneurysm (series 9, image 91). ICA termini widely patent. A1 segments patent bilaterally. Left A1 hypoplastic, accounting for the diminutive left ICA is compared to the right.  Anterior communicating artery widely patent. Anterior cerebral arteries widely patent to their distal aspects. No M1 stenosis or occlusion. Normal MCA bifurcations. Distal MCA branches well perfused bilaterally. Avid flow within the intraventricular lesion on MRA. POSTERIOR CIRCULATION: Vertebral arteries widely patent to the vertebrobasilar junction without stenosis. Left vertebral artery dominant. Partially visualized posterior inferior cerebral arteries patent bilaterally. Basilar widely patent to its distal aspect without stenosis. Superior cerebral arteries patent bilaterally. Both of the posterior cerebral arteries primarily supplied via the basilar, although small bilateral posterior communicating arteries noted. PCAs widely patent to their distal aspects without stenosis. IMPRESSION: MRI HEAD IMPRESSION: 1. 8 mm subacute right holo hemispheric subdural hematoma. Mild mass effect on the subjacent right cerebral hemisphere without midline shift. 2. 9 mm round intraventricular lesion arising from the septum pellucidum. Finding is of uncertain etiology, and could reflect an underlying intraventricular neoplasm. Alternatively, a vascular lesion could also be considered. Follow-up examination with postcontrast imaging suggested for complete evaluation. Additionally, further assessment with dedicated angiography may be helpful for further characterization. 3. 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 The intraventricular lesion is suspected to be the underlying etiology of this finding. Small aneurysm seen on corresponding MRA as below is felt to be unlikely to cause the degree of chronic hemosiderin staining. 4. Mild chronic small vessel ischemic disease. MRA HEAD IMPRESSION: 1. Negative MRA for large vessel occlusion. No high-grade or correctable stenosis. 2. 3 mm proximal cavernous  right ICA aneurysm. Electronically Signed   By: Jeannine Boga M.D.   On: 03/25/2018 03:31   Mr Virgel Paling GX Contrast  Result Date: 03/25/2018 CLINICAL DATA:  Initial evaluation for left facial droop, left hand numbness. EXAM: MRI HEAD WITHOUT CONTRAST MRA HEAD WITHOUT CONTRAST TECHNIQUE: Multiplanar, multiecho pulse sequences of the brain and surrounding structures were obtained without intravenous contrast. Angiographic images of the head were obtained using MRA technique without contrast. COMPARISON:  Prior CT from 03/24/2018. FINDINGS: MRI HEAD FINDINGS Brain: Generalized age-related cerebral volume loss. Patchy and confluent T2/FLAIR hyperintensity within the periventricular and deep white matter both cerebral hemispheres, most likely related chronic small vessel ischemic change, mild for age. No abnormal foci of restricted diffusion to suggest acute or subacute ischemia. Gray-white matter differentiation maintained. No areas of remote cortical infarction. Previously identified right holo hemispheric subacute subdural hematoma again seen. This measures up to 8 mm in maximal thickness. Probable small amount of subjacent subarachnoid blood within underlying right frontal cortical sulci on FLAIR sequence. Mild mass effect on the subjacent right cerebral hemisphere without midline shift. Basilar cisterns remain patent. There is a round T2 hypointense, T1 hyperintense lesion positioned along the left aspect of the septum pellucidum measuring 9 x 7 x 7 mm (series 18, image 34). While this appeared as possible hemorrhage on prior CT, this appears more masslike on this examination. Lesion appears attached to the adjacent septum pellucidum by a small stalk. Finding could reflect an intraventricular neoplasm possibly a vascular lesion. Evidence of chronic intraventricular hemorrhage with susceptibility artifacts seen posteriorly within both lateral ventricles. No hydrocephalus. Superficial siderosis seen about  the basilar cisterns as well as the cerebellar vermis and bilateral sylvian fissures, with extension around the brainstem and upper spinal cord. Finding suggests repeated chronic subarachnoid hemorrhage. No other mass lesion. Pituitary gland suprasellar region within normal limits. Midline structures intact. Vascular: Major intracranial vascular flow voids are maintained. Skull and upper cervical spine: Craniocervical junction within normal limits. Upper cervical spine normal. Bone marrow signal intensity normal. No scalp soft tissue abnormality. Sinuses/Orbits: Globes and orbital soft tissues demonstrate no acute finding. Patient status post ocular lens extraction bilaterally. Paranasal sinuses are largely clear. No mastoid effusion. Inner ear structures normal. Other: None. MRA HEAD FINDINGS ANTERIOR CIRCULATION: Distal cervical segments of the internal carotid arteries are patent with antegrade flow. Petrous, cavernous, and supraclinoid segments widely patent bilaterally. Small 3 mm focal outpouching extending from the cavernous right ICA, suspicious for small aneurysm (series 9, image 91). ICA termini widely patent. A1 segments patent bilaterally. Left A1 hypoplastic, accounting for the diminutive left ICA is compared to the right. Anterior communicating artery widely patent. Anterior cerebral arteries widely patent to their distal aspects. No M1 stenosis or occlusion. Normal MCA bifurcations. Distal MCA branches well perfused bilaterally. Avid flow within the intraventricular lesion on MRA. POSTERIOR CIRCULATION: Vertebral arteries widely patent to the vertebrobasilar junction without stenosis. Left vertebral artery dominant. Partially visualized posterior inferior cerebral arteries patent bilaterally. Basilar widely patent to its distal aspect without stenosis. Superior cerebral arteries patent bilaterally. Both of the posterior cerebral arteries primarily supplied via the basilar, although small bilateral  posterior communicating arteries noted. PCAs widely patent to their distal aspects without stenosis. IMPRESSION: MRI HEAD IMPRESSION: 1. 8 mm subacute right holo hemispheric subdural hematoma. Mild mass effect on the subjacent right cerebral hemisphere without midline shift. 2. 9 mm round intraventricular lesion arising from the septum pellucidum. Finding is of uncertain etiology, and could reflect an underlying  intraventricular neoplasm. Alternatively, a vascular lesion could also be considered. Follow-up examination with postcontrast imaging suggested for complete evaluation. Additionally, further assessment with dedicated angiography may be helpful for further characterization. 3. 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 The intraventricular lesion is suspected to be the underlying etiology of this finding. Small aneurysm seen on corresponding MRA as below is felt to be unlikely to cause the degree of chronic hemosiderin staining. 4. Mild chronic small vessel ischemic disease. MRA HEAD IMPRESSION: 1. Negative MRA for large vessel occlusion. No high-grade or correctable stenosis. 2. 3 mm proximal cavernous right ICA aneurysm. Electronically Signed   By: Jeannine Boga M.D.   On: 03/25/2018 03:31        Scheduled Meds: . cholecalciferol  1,000 Units Oral Daily  . folic acid  1 mg Oral QODAY  . omega-3 acid ethyl esters  1 g Oral BID  . PHENobarbital  64.8 mg Oral BID  . pravastatin  40 mg Oral q1800   Continuous Infusions:   LOS: 0 days    Time spent: [place], MD [facility]  If 7PM-7AM, please contact night-coverage [email or web address] Password [email or web address] 03/25/2018, 10:08 AM

## 2018-03-25 NOTE — Care Management Obs Status (Signed)
[place] NOTIFICATION   Patient Details  Name: Lisa Phillips MRN: [id number] Date of Birth: 04-04-1931   Medicare Observation Status Notification Given:  Yes    Carles Collet, RN 03/25/2018, 2:36 PM

## 2018-03-25 NOTE — ED Notes (Signed)
Pt in MRI.

## 2018-03-25 NOTE — Progress Notes (Signed)
STROKE TEAM PROGRESS NOTE   HISTORY OF PRESENT ILLNESS (per record) Lisa Phillips is an 84 y.o. female presenting via EMS with transient episode of left hand tingling and left sided facial droop tonight that lasted for about 5 minutes. Symptoms now resolved. She had a fall 2 weeks ago and is complaining of neck pain. CT head revealed a subacute right small subdural hematoma along the anterior convexity of the frontal lobe, in addition to a small amount of intraventricular blood.   Of note, the patient has a history of seizures. Home medications include phenobarbital 60 mg/ml injection. She also takes ASA at home.   CT head: 1.Thin 6 mm right frontoparietal subdural hematoma, mostly subacute in appearance with small scattered foci of petechial more acute appearing hemorrhage. Small foci of hemorrhage along the septum pellucidum on with probable loculated small focus of hemorrhage within the left frontal horn of the ventricle. No midline shift. No mass effect. 2. Atrophy with mild small vessel ischemic changes of the white matter     SUBJECTIVE (INTERVAL HISTORY) The patient's daughter was at the bedside.  The patient is very alert and animated.  She appears to be doing well.  Home health physical therapy was recommended.  An MRI of the brain with contrast is pending.  The patient is anxious for discharge.    OBJECTIVE Temp:  [97.2 F (36.2 C)-98 F (36.7 C)] 97.2 F (36.2 C) (06/23 0848) Pulse Rate:  [72-86] 78 (06/23 0848) Cardiac Rhythm: Normal sinus rhythm (06/23 0700) Resp:  [12-16] 12 (06/23 0848) BP: (125-159)/(56-94) 159/94 (06/23 0848) SpO2:  [95 %-97 %] 95 % (06/23 0848) Weight:  [195 lb (88.5 kg)] 195 lb (88.5 kg) (06/22 2131)  CBC:  Recent Labs  Lab 03/24/18 2152 03/24/18 2208  WBC 8.4  --   NEUTROABS 4.4  --   HGB 13.6 13.3  HCT 42.6 39.0  MCV 93.0  --   PLT 236  --     Basic Metabolic Panel:  Recent Labs  Lab 03/24/18 2152 03/24/18 2208  NA 140 141   K 3.9 3.9  CL 100* 103  CO2 29  --   GLUCOSE 115* 111*  BUN 15 17  CREATININE 0.84 0.80  CALCIUM 9.1  --     Lipid Panel:     Component Value Date/Time   CHOL 211 (H) 03/25/2018 0837   TRIG 110 03/25/2018 0837   HDL 84 03/25/2018 0837   CHOLHDL 2.5 03/25/2018 0837   VLDL 22 03/25/2018 0837   LDLCALC 105 (H) 03/25/2018 0837   HgbA1c:  Lab Results  Component Value Date   HGBA1C 5.6 03/25/2018   Urine Drug Screen:     Component Value Date/Time   LABOPIA NONE DETECTED 03/24/2018 2200   COCAINSCRNUR NONE DETECTED 03/24/2018 2200   LABBENZ NONE DETECTED 03/24/2018 2200   AMPHETMU NONE DETECTED 03/24/2018 2200   THCU NONE DETECTED 03/24/2018 2200   LABBARB (A) 03/24/2018 2200    Result not available. Reagent lot number recalled by manufacturer.    Alcohol Level     Component Value Date/Time   ETH <10 03/24/2018 2152    IMAGING   Ct Head Wo Contrast 03/24/2018 IMPRESSION:  1. Thin 6 mm right frontoparietal subdural hematoma, mostly subacute in appearance with small scattered foci of petechial more acute appearing hemorrhage. Small foci of hemorrhage along the septum lucent on with probable loculated small focus of hemorrhage within the left frontal horn of the ventricle. No midline shift. No mass  effect.  2. Atrophy with mild small vessel ischemic changes of the white matter   Mr [facility] Wo Contrast 03/25/2018 IMPRESSION:   MRI HEAD  1. 8 mm subacute right holo hemispheric subdural hematoma. Mild mass effect on the subjacent right cerebral hemisphere without midline shift.  2. 9 mm round intraventricular lesion arising from the septum pellucidum. Finding is of uncertain etiology, and could reflect an underlying intraventricular neoplasm. Alternatively, a vascular lesion could also be considered. Follow-up examination with postcontrast imaging suggested for complete evaluation. Additionally, further assessment with dedicated angiography may be helpful for further  characterization.  3. 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 The intraventricular lesion is suspected to be the underlying etiology of this finding. Small aneurysm seen on corresponding MRA as below is felt to be unlikely to cause the degree of chronic hemosiderin staining.  4. Mild chronic small vessel ischemic disease.     MRA HEAD IMPRESSION:  1. Negative MRA for large vessel occlusion. No high-grade or correctable stenosis.  2. 3 mm proximal cavernous right ICA aneurysm.     MR Brain W Contrast - pending 03/25/2018     Transthoracic Echocardiogram - pending 03/25/2018    Bilateral Carotid Dopplers - pending 03/25/2018     PHYSICAL EXAM Vitals:   03/25/18 0259 03/25/18 0459 03/25/18 0656 03/25/18 0848  BP: (!) 142/81 131/78 131/78 (!) 159/94  Pulse: 72 77 77 78  Resp: 16 16 16 12   Temp: 97.9 F (36.6 C) (!) 97.5 F (36.4 C) (!) 97.4 F (36.3 C) (!) 97.2 F (36.2 C)  TempSrc: Oral Oral Oral Oral  SpO2: 96% 95% 96% 95%  Weight:      Height:       Pleasant elderly lady not in distress. . Afebrile. Head is nontraumatic. Neck is supple without bruit.    Cardiac exam no murmur or gallop. Lungs are clear to auscultation. Distal pulses are well felt.  Neurological Exam ;  Awake  Alert oriented x 3. Normal speech and language.eye movements full without nystagmus.fundi were not visualized. Vision acuity and fields appear normal. Hearing is normal. Palatal movements are normal. Face symmetric. Tongue midline. Normal strength, tone, reflexes and coordination except mild bilateral LE proximal symmetric weakness 4/5. Normal sensation. Gait deferred.           ASSESSMENT/PLAN [name] is a 84 y.o. female with history of arthritis, hyperlipidemia, and a seizure disorder presenting with transient left facial droop, and left hand  tingling. She did not receive IV t-PA due to SDH.  8 mm subacute right holo hemispheric subacute subdural hematoma.likley s/p fall in May 2019. Transient left face droop and hand mumbness - TIA from small vessel disease  Or simple partial seizure  Resultant - resolution of deficits.  CT head - Thin 6 mm right frontoparietal subdural hematoma.  EEG - pending  MRI head - 8 mm subacute right holo hemispheric subdural hematoma.  Chronic IVH.  MRA head - 3 mm proximal cavernous right ICA aneurysm.   MRI W Contrast - pending for further evaluation of a 9 mm round intraventricular lesion arising from the septum pellucidum.  Carotid Doppler - pending  2D Echo - pending  LDL - 105  HgbA1c - 5.6  VTE prophylaxis - SCDs Diet Order           Diet Heart Room service appropriate? Yes; Fluid consistency: Thin  Diet effective now  aspirin 81 mg daily prior to admission, now on No antithrombotic  Ongoing aggressive stroke risk factor management  Therapy recommendations: Home health PT and OT  Disposition:  Pending  Hypertension  Mildly elevated but stable . Long-term BP goal normotensive  Hyperlipidemia  Lipid lowering medication PTA: Mevacor 20 mg daily  LDL 105, goal < 70  Current lipid lowering medication: Pravastatin 40 mg daily  Continue statin at discharge    Other Stroke Risk Factors  Advanced age  Former cigarette smoker - quit  Obesity, Body mass index is 34 kg/m., recommend weight loss, diet and exercise as appropriate    Other Active Problems  3 mm proximal cavernous right ICA aneurysm -outpatient follow-up Dr. Leonie Man.   9 mm round intraventricular lesion -MR preliminary - benign lesion  Seizure disorder - continue phenobarbital  Stroke prophylaxis -resume aspirin 81 mg daily per Dr. Leonie Man.   Plan / Recommendations   Home health therapies  Follow-up Dr. Leonie Man in 6 to 8 weeks  Await carotid Dopplers and 2D echo  Continue  pravastatin 40 mg daily  Await EEG - continue phenobarbital  Continue aspirin 81 mg daily   Hospital day # 0  Mikey Bussing PA-C [facility] Pager [phone] 03/25/2018, 1:52 PM I have personally examined this patient, reviewed notes, independently viewed imaging studies, participated in medical decision making and plan of care.ROS completed by me personally and pertinent positives fully documented  I have made any additions or clarifications directly to the above note. Agree with note above.. She presented with transient facial droop and left hand paresthesias possibly TIA due to small vessel disease though simple partial seizure is also possibility given subacute right subdural hematoma following a fall in May 2019.  Continue phenobarbital for a remote history of seizures and now recent subdural as well as aspirin 81 mg daily for possible TIA.  Long discussion with the patient and daughter at the bedside and answered questions.  Greater than 50% time during this 35-minute visit was spent in counseling and coordination of care about seizures, TIA, subdural and answering questions Antony Contras, MD Medical Director [place] Pager: [phone] 03/25/2018 2:31 PM   To contact Stroke Continuity provider, please refer to [email or web address] After hours, contact General Neurology

## 2018-03-25 NOTE — Evaluation (Signed)
Physical Therapy Evaluation Patient Details Name: Lisa Phillips MRN: [id number] DOB: [birth year] Today's Date: 03/25/2018   History of Present Illness  Lisa Phillips is a 84 y.o. female with medical history significant for cancer of the left breast cancer status post mastectomy, and seizure disorder, multiple falls with last one 2 weeks ago when out tending to her flowers. Now presenting to the emergency department after a transient episode involving left hand paresthesias and left facial droop with speech difficulty.  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 Pt has weakness and mildly limited ROM in LE, chronic back pain, and history of falls which incr likelihood of fall in future.  Recommend PT for balance retraining in [facility] setting until pt able to enter/exit home without incr effort.  Anticipate d/c home today with family, has all DME needed at this time. No further acute needs.      Follow Up Recommendations Home health PT(for balance retraining)    Equipment Recommendations  None recommended by PT    Recommendations for Other Services       Precautions / Restrictions Precautions Precautions: Fall Precaution Comments: History of falls with last one 2 weeks ago Restrictions Weight Bearing Restrictions: No      Mobility  Bed Mobility Overal bed mobility: Needs Assistance Bed Mobility: Sit to Supine       Sit to supine: Min assist;HOB elevated   General bed mobility comments: did not observe, pt up in chair and MD came in at  end  Transfers Overall transfer level: Needs assistance Equipment used: Rolling walker (2 wheeled) Transfers: Sit to/from Stand Sit to Stand: Min assist         General transfer comment: instructed on sit to stand technique and repeated x4 on armless chair with RW and PT assist to support coordination and quad activiation ("stand strong") as instructed on controlled descent for eccentric retraining of quads to promote strength/power.  Pt advised to pause upon standing to assure stability before step off  Ambulation/Gait Ambulation/Gait assistance: Min guard Gait Distance (Feet): 60 Feet Assistive device: Rolling walker (2 wheeled) Gait Pattern/deviations: Step-through pattern;Narrow base of support;Staggering right(1x lob to right recovers with min assist)     General Gait Details: decr heel strike noted right>left, pt cued to increased step length and able to maintain and sustain until distracted by talking; improved confidence and stability with RW but pt reluctant to use at home  Stairs            Wheelchair Mobility    Modified Rankin (Stroke Patients Only)       Balance Overall balance assessment: Needs assistance Sitting-balance support: No upper extremity supported;Feet supported Sitting balance-Leahy Scale: Good     Standing balance support: No upper extremity supported;During functional activity Standing balance-Leahy Scale: Fair Standing balance comment: standing at RW prior to rhomberg assessment Single Leg Stance - Right Leg: 0 Single Leg Stance - Left Leg: 0 Tandem Stance - Right Leg: 0 Tandem Stance - Left Leg: 0 Rhomberg - Eyes Opened: 10(incr sway and PT close supervision for safety) Rhomberg - Eyes Closed: 0(with min guard assist pt able to maintain 10 secondd) High level  balance activites: Turns High Level Balance Comments: incre time to turn 180 degree with RW, improved on second walk but remains evident             Pertinent Vitals/Pain  Pain Assessment: No/denies pain    Home Living Family/patient expects to be discharged to:: Private residence Living Arrangements: Children Available Help at Discharge: Family;Available 24 hours/day Type of Home: House Home Access: Level entry     Home Layout: Multi-level Home Equipment: Walker - 2 wheels;Cane - quad;Shower seat Additional Comments: considering getting 4WW    Prior Function Level of Independence: Independent         Comments: Drives and does yard work, 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  poor muscular endurance presents as 'jello knees' after short duration; pt blames low bac       Communication   Communication: No difficulties  Cognition Arousal/Alertness: Awake/alert Behavior During Therapy: WFL for tasks assessed/performed Overall Cognitive Status: Within Functional Limits for tasks assessed                                 General Comments: pt demonstrates difficulty with dual tasking ie walk-talk test reveals gait changes when talking and pt reports difficulty carrying items and walking.      General Comments General comments (skin integrity, edema, etc.): see above; edema/redness at right ankle/foot vs. left    Exercises     Assessment/Plan    PT Assessment All further PT needs can be met in the next venue of care  PT Problem List Obesity;Decreased knowledge of use of DME;Decreased mobility;Decreased balance;Decreased range of motion;Decreased  strength;Decreased activity tolerance       PT Treatment Interventions      PT Goals (Current goals can be found in the Care Plan section)  Acute Rehab PT Goals Patient Stated Goal: get around faster without falling; continue to work in yard and not need help PT Goal Formulation: All assessment and education complete, DC therapy    Frequency     Barriers to discharge        Co-evaluation               AM-PAC PT "6 Clicks" Daily Activity  Outcome Measure Difficulty turning over in bed (including adjusting bedclothes, sheets and blankets)?: A Little Difficulty moving from lying on back to sitting on the side of the bed? : A Little Difficulty sitting down on and standing up from a chair with arms (e.g., wheelchair, bedside commode, etc,.)?: A Little Help needed moving to and from a bed to chair (including a wheelchair)?: A Little Help needed walking in hospital room?: None Help needed climbing 3-5 steps with a railing? : A Little 6 Click Score: 19    End of Session Equipment Utilized During Treatment: Gait belt Activity Tolerance: Patient tolerated treatment well Patient left: in chair;with nursing/sitter in room;with family/visitor present(MD and daugther in room) Nurse Communication: Mobility status PT Visit Diagnosis: Unsteadiness on  feet (R26.81);Repeated falls (R29.6);History of falling (Z91.81)    Time: 5498-2641 PT Time Calculation (min) (ACUTE ONLY): 45 min   Charges:   PT Evaluation $PT Eval Moderate Complexity: 1 Mod PT Treatments $Gait Training: 8-22 mins $Therapeutic Activity: 23-37 mins   PT G Codes:       Kearney Hard, PT, DPT, MS Board Certified Geriatric Clinical Specialist  Herbie Drape 03/25/2018, 10:55 AM

## 2018-03-26 ENCOUNTER — Observation Stay (HOSPITAL_COMMUNITY): Payer: Medicare Other

## 2018-03-26 ENCOUNTER — Encounter (HOSPITAL_COMMUNITY): Payer: Self-pay | Admitting: *Deleted

## 2018-03-26 ENCOUNTER — Observation Stay (HOSPITAL_BASED_OUTPATIENT_CLINIC_OR_DEPARTMENT_OTHER): Payer: Medicare Other

## 2018-03-26 DIAGNOSIS — S065X9A Traumatic subdural hemorrhage with loss of consciousness of unspecified duration, initial encounter: Secondary | ICD-10-CM | POA: Diagnosis not present

## 2018-03-26 DIAGNOSIS — I6200 Nontraumatic subdural hemorrhage, unspecified: Secondary | ICD-10-CM | POA: Diagnosis not present

## 2018-03-26 DIAGNOSIS — R569 Unspecified convulsions: Secondary | ICD-10-CM | POA: Diagnosis not present

## 2018-03-26 DIAGNOSIS — R29818 Other symptoms and signs involving the nervous system: Secondary | ICD-10-CM | POA: Diagnosis not present

## 2018-03-26 NOTE — Plan of Care (Signed)
Adequate for discharge.

## 2018-03-26 NOTE — Procedures (Signed)
ELECTROENCEPHALOGRAM REPORT  Date of Study: 03/26/2018  Patient's Name: Lisa Phillips MRN: [id number] Date of Birth: 1930/12/26  Referring Provider: Dr. Christia Reading Opyd  Clinical History: This is an 84 year old woman with history of seizures with subdural and subarachnoid hematoma.  Medications: Phenobarbital  Technical Summary: A multichannel digital EEG recording measured by the international 10-20 system with electrodes applied with paste and impedances below 5000 ohms performed in our laboratory with EKG monitoring in an awake and asleep patient.  Hyperventilation and photic stimulation were not performed.  The digital EEG was referentially recorded, reformatted, and digitally filtered in a variety of bipolar and referential montages for optimal display.    Description: The patient is awake and asleep during the recording.  During maximal wakefulness, there is a symmetric, medium voltage 8 Hz posterior dominant rhythm that attenuates with eye opening.  The record is symmetric.  During drowsiness and sleep, there is an increase in theta slowing of the background with occasional vertex waves seen.  Hyperventilation and photic stimulation were not performed. There were frequent bursts of low to medium voltage generalized spike and polyspike and wave discharges with frontal predominant, at times with left-sided lead-in. There were no electrographic seizures seen.    EKG lead was unremarkable.  Impression: This awake and asleep EEG is abnormal due to frequent generalized spike and polyspike and wave discharges with frontal predominance, at times with left-sided lead-in.  Clinical Correlation of the above findings is suggestive of the interictal expression of a primary generalized epilepsy. Left-sided lead-in also raises the possibility of a focal epilepsy with secondary bisynchrony. Clinical correlation is advised.    Ellouise Newer, M.D.

## 2018-03-26 NOTE — Discharge Summary (Signed)
Physician Discharge Summary  Lisa Phillips NFA:[id number] DOB: 1931/03/01 DOA: 03/24/2018  PCP: Serita Grammes, MD  Admit date: 03/24/2018 Discharge date: 03/26/2018  Admitted From: Home Disposition: Home  Recommendations for Outpatient Follow-up:  1. Follow up with PCP in 1-2 weeks 2. Please obtain BMP/CBC in one week 3. Please repeat an MRI in approximately 6 months 4. Follow-up EEG done on 03/26/2018  Home Health: Yes, home health PT and OT Equipment/Devices: Walker  Discharge Condition: Stable CODE STATUS: Full Diet recommendation: Heart Healthy  Brief/Interim Summary:  #) Transient neurological deficit due to subdural hematoma: Patient's symptoms were felt to be likely secondary to right-sided subdural hematoma if there is no evidence of ischemic stroke.  Hematoma appears to be chronic.  Patient was seen by neurology.  There recommendation was to hold aspirin for 2 to 3 weeks and repeat head CT to look for stability before restarting.  Patient did have carotid ultrasounds that showed only a 1 to 39% stenosis of the ICA.  Patient was evaluated by physical therapy who recommended home health PT for gait training.  #) Intraventricular lesion, gated by hemorrhage: Patient was noted to have on MRI a round intraventricular lesion with evidence of chronic intraventricular hemorrhage.  On postcontrast MRI showed that this intraventricular mass.  A proteinaceous cyst or colloid cyst with recommendation to follow-up in 6 months to assess for stability.  #)  seizures: She was continued on phenobarbital 65 mg twice daily.  A spot EEG was ordered and is pending.  #) Disease hyperlipidemia: Patient was continued on statin.  Discharge Diagnoses:  Principal Problem:   Transient neurologic deficit Active Problems:   Seizures (HCC)   Subdural hematoma (HCC)   Pressure injury of skin    Discharge Instructions  Discharge Instructions    Call MD for:  difficulty breathing, headache  or visual disturbances   Complete by:  As directed    Call MD for:  hives   Complete by:  As directed    Call MD for:  persistant dizziness or light-headedness   Complete by:  As directed    Call MD for:  persistant nausea and vomiting   Complete by:  As directed    Call MD for:  redness, tenderness, or signs of infection (pain, swelling, redness, odor or green/yellow discharge around incision site)   Complete by:  As directed    Call MD for:  severe uncontrolled pain   Complete by:  As directed    Call MD for:  temperature >100.4   Complete by:  As directed    Diet - low sodium heart healthy   Complete by:  As directed    Discharge instructions   Complete by:  As directed    Please follow-up with your primary care doctor in 1 to 2 weeks.  Please follow-up with a neurologist.  He will need a repeat MRI in 6 months to evaluate for a spot on your brain.   Increase activity slowly   Complete by:  As directed      Allergies as of 03/26/2018      Reactions   Ciprofloxacin Nausea Only   Flagyl [metronidazole Hcl] Rash   All over the body.      Medication List    STOP taking these medications   aspirin EC 81 MG tablet     TAKE these medications   cholecalciferol 1000 units tablet Commonly known as:  VITAMIN D Take 1,000 Units by mouth daily.   Fish Oil  1000 MG Caps Take 1,000 mg by mouth 3 (three) times daily.   folic acid 355 MCG tablet Commonly known as:  FOLVITE Take 400 mcg by mouth daily.   lovastatin 20 MG tablet Commonly known as:  MEVACOR Take 40 mg by mouth at bedtime.   PHENObarbital 60 MG tablet Commonly known as:  LUMINAL Take 60 mg by mouth 2 (two) times daily.       Allergies  Allergen Reactions  . Ciprofloxacin Nausea Only  . Flagyl [Metronidazole Hcl] Rash    All over the body.    Consultations:  Neurology   Procedures/Studies: Ct Head Wo Contrast  Result Date: 03/24/2018 CLINICAL DATA:  Left hand tingling and facial droop EXAM: CT  HEAD WITHOUT CONTRAST TECHNIQUE: Contiguous axial images were obtained from the base of the skull through the vertex without intravenous contrast. COMPARISON:  None. FINDINGS: Brain: No acute territorial infarction or intracranial mass is visualized. Thin right frontal parietal mostly subacute appearing subdural hematoma measuring 6 mm maximum thickness on coronal views with small punctate foci of increased density likely foci of more acute hemorrhage. Small amount of acute hemorrhage along the septum pellucidum with probable loculated hemorrhage in the left frontal horn of the ventricle. No midline shift. Moderate atrophy. Prominent ventricles. Mild small vessel ischemic changes of the white matter Vascular: No hyperdense vessels.  Carotid vascular calcification Skull: Normal. Negative for fracture or focal lesion. Sinuses/Orbits: Mild mucosal thickening in the maxillary and ethmoid sinuses. No acute orbital abnormality Other: None IMPRESSION: 1. Thin 6 mm right frontoparietal subdural hematoma, mostly subacute in appearance with small scattered foci of petechial more acute appearing hemorrhage. Small foci of hemorrhage along the septum lucent on with probable loculated small focus of hemorrhage within the left frontal horn of the ventricle. No midline shift. No mass effect. 2. Atrophy with mild small vessel ischemic changes of the white matter Critical Value/emergent results were called by telephone at the time of interpretation on 03/24/2018 at 11:16 pm to Dr. Merrily Pew , who verbally acknowledged these results. Electronically Signed   By: Donavan Foil M.D.   On: 03/24/2018 23:16   Mr Brain Wo Contrast  Result Date: 03/25/2018 CLINICAL DATA:  Initial evaluation for left facial droop, left hand numbness. EXAM: MRI HEAD WITHOUT CONTRAST MRA HEAD WITHOUT CONTRAST TECHNIQUE: Multiplanar, multiecho pulse sequences of the brain and surrounding structures were obtained without intravenous contrast. Angiographic  images of the head were obtained using MRA technique without contrast. COMPARISON:  Prior CT from 03/24/2018. FINDINGS: MRI HEAD FINDINGS Brain: Generalized age-related cerebral volume loss. Patchy and confluent T2/FLAIR hyperintensity within the periventricular and deep white matter both cerebral hemispheres, most likely related chronic small vessel ischemic change, mild for age. No abnormal foci of restricted diffusion to suggest acute or subacute ischemia. Gray-white matter differentiation maintained. No areas of remote cortical infarction. Previously identified right holo hemispheric subacute subdural hematoma again seen. This measures up to 8 mm in maximal thickness. Probable small amount of subjacent subarachnoid blood within underlying right frontal cortical sulci on FLAIR sequence. Mild mass effect on the subjacent right cerebral hemisphere without midline shift. Basilar cisterns remain patent. There is a round T2 hypointense, T1 hyperintense lesion positioned along the left aspect of the septum pellucidum measuring 9 x 7 x 7 mm (series 18, image 34). While this appeared as possible hemorrhage on prior CT, this appears more masslike on this examination. Lesion appears attached to the adjacent septum pellucidum by a small stalk. Finding could reflect  an intraventricular neoplasm possibly a vascular lesion. Evidence of chronic intraventricular hemorrhage with susceptibility artifacts seen posteriorly within both lateral ventricles. No hydrocephalus. Superficial siderosis seen about the basilar cisterns as well as the cerebellar vermis and bilateral sylvian fissures, with extension around the brainstem and upper spinal cord. Finding suggests repeated chronic subarachnoid hemorrhage. No other mass lesion. Pituitary gland suprasellar region within normal limits. Midline structures intact. Vascular: Major intracranial vascular flow voids are maintained. Skull and upper cervical spine: Craniocervical junction  within normal limits. Upper cervical spine normal. Bone marrow signal intensity normal. No scalp soft tissue abnormality. Sinuses/Orbits: Globes and orbital soft tissues demonstrate no acute finding. Patient status post ocular lens extraction bilaterally. Paranasal sinuses are largely clear. No mastoid effusion. Inner ear structures normal. Other: None. MRA HEAD FINDINGS ANTERIOR CIRCULATION: Distal cervical segments of the internal carotid arteries are patent with antegrade flow. Petrous, cavernous, and supraclinoid segments widely patent bilaterally. Small 3 mm focal outpouching extending from the cavernous right ICA, suspicious for small aneurysm (series 9, image 91). ICA termini widely patent. A1 segments patent bilaterally. Left A1 hypoplastic, accounting for the diminutive left ICA is compared to the right. Anterior communicating artery widely patent. Anterior cerebral arteries widely patent to their distal aspects. No M1 stenosis or occlusion. Normal MCA bifurcations. Distal MCA branches well perfused bilaterally. Avid flow within the intraventricular lesion on MRA. POSTERIOR CIRCULATION: Vertebral arteries widely patent to the vertebrobasilar junction without stenosis. Left vertebral artery dominant. Partially visualized posterior inferior cerebral arteries patent bilaterally. Basilar widely patent to its distal aspect without stenosis. Superior cerebral arteries patent bilaterally. Both of the posterior cerebral arteries primarily supplied via the basilar, although small bilateral posterior communicating arteries noted. PCAs widely patent to their distal aspects without stenosis. IMPRESSION: MRI HEAD IMPRESSION: 1. 8 mm subacute right holo hemispheric subdural hematoma. Mild mass effect on the subjacent right cerebral hemisphere without midline shift. 2. 9 mm round intraventricular lesion arising from the septum pellucidum. Finding is of uncertain etiology, and could reflect an underlying intraventricular  neoplasm. Alternatively, a vascular lesion could also be considered. Follow-up examination with postcontrast imaging suggested for complete evaluation. Additionally, further assessment with dedicated angiography may be helpful for further characterization. 3. 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 The intraventricular lesion is suspected to be the underlying etiology of this finding. Small aneurysm seen on corresponding MRA as below is felt to be unlikely to cause the degree of chronic hemosiderin staining. 4. Mild chronic small vessel ischemic disease. MRA HEAD IMPRESSION: 1. Negative MRA for large vessel occlusion. No high-grade or correctable stenosis. 2. 3 mm proximal cavernous right ICA aneurysm. Electronically Signed   By: Jeannine Boga M.D.   On: 03/25/2018 03:31   Mr Brain W Contrast  Result Date: 03/25/2018 CLINICAL DATA:  Left facial droop and left hand numbness. Subdural hemorrhage. Follow-up evaluation of intraventricular lesion on noncontrast MRI. EXAM: MRI HEAD WITH CONTRAST TECHNIQUE: Multiplanar, multiecho pulse sequences of the brain and surrounding structures were obtained with intravenous contrast. CONTRAST:  75mL MULTIHANCE GADOBENATE DIMEGLUMINE 529 MG/ML IV SOLN COMPARISON:  Noncontrast brain MRI 03/25/2018 FINDINGS: Assessment for enhancement of the 9 mm lesion in the anterior body of the left lateral ventricle along the septum pellucidum is limited due to its uniform intrinsic T1 hyperintensity. There is no surrounding enhancement or enhancing intraventricular mass separate from this lesion. Based on the prior noncontrast examination which demonstrated T2 hypointensity, the T1 signal  does not reflect fat, and there is no significant blooming on susceptibility weighted images to suggest that this simply represents blood. There is diffuse smooth dural  thickening enhancement over the right cerebral convexity associated with the previously described subdural hematoma. IMPRESSION: 1. 9 mm left lateral intraventricular mass without convincing enhancement. The signal characteristics favor a proteinaceous cyst, and colloid cysts can rarely occur in the lateral ventricles. A melanoma metastasis could give this appearance, however there is no supporting clinical history of such and no evidence of intracranial metastases elsewhere. A follow-up brain MRI is recommended in 6 months to assess stability. 2. Diffuse smooth dural enhancement over the right cerebral convexity associated with the subdural hematoma. Electronically Signed   By: Logan Bores M.D.   On: 03/25/2018 14:33   Mr Jodene Nam Head Wo Contrast  Result Date: 03/25/2018 CLINICAL DATA:  Initial evaluation for left facial droop, left hand numbness. EXAM: MRI HEAD WITHOUT CONTRAST MRA HEAD WITHOUT CONTRAST TECHNIQUE: Multiplanar, multiecho pulse sequences of the brain and surrounding structures were obtained without intravenous contrast. Angiographic images of the head were obtained using MRA technique without contrast. COMPARISON:  Prior CT from 03/24/2018. FINDINGS: MRI HEAD FINDINGS Brain: Generalized age-related cerebral volume loss. Patchy and confluent T2/FLAIR hyperintensity within the periventricular and deep white matter both cerebral hemispheres, most likely related chronic small vessel ischemic change, mild for age. No abnormal foci of restricted diffusion to suggest acute or subacute ischemia. Gray-white matter differentiation maintained. No areas of remote cortical infarction. Previously identified right holo hemispheric subacute subdural hematoma again seen. This measures up to 8 mm in maximal thickness. Probable small amount of subjacent subarachnoid blood within underlying right frontal cortical sulci on FLAIR sequence. Mild mass effect on the subjacent right cerebral hemisphere without midline  shift. Basilar cisterns remain patent. There is a round T2 hypointense, T1 hyperintense lesion positioned along the left aspect of the septum pellucidum measuring 9 x 7 x 7 mm (series 18, image 34). While this appeared as possible hemorrhage on prior CT, this appears more masslike on this examination. Lesion appears attached to the adjacent septum pellucidum by a small stalk. Finding could reflect an intraventricular neoplasm possibly a vascular lesion. Evidence of chronic intraventricular hemorrhage with susceptibility artifacts seen posteriorly within both lateral ventricles. No hydrocephalus. Superficial siderosis seen about the basilar cisterns as well as the cerebellar vermis and bilateral sylvian fissures, with extension around the brainstem and upper spinal cord. Finding suggests repeated chronic subarachnoid hemorrhage. No other mass lesion. Pituitary gland suprasellar region within normal limits. Midline structures intact. Vascular: Major intracranial vascular flow voids are maintained. Skull and upper cervical spine: Craniocervical junction within normal limits. Upper cervical spine normal. Bone marrow signal intensity normal. No scalp soft tissue abnormality. Sinuses/Orbits: Globes and orbital soft tissues demonstrate no acute finding. Patient status post ocular lens extraction bilaterally. Paranasal sinuses are largely clear. No mastoid effusion. Inner ear structures normal. Other: None. MRA HEAD FINDINGS ANTERIOR CIRCULATION: Distal cervical segments of the internal carotid arteries are patent with antegrade flow. Petrous, cavernous, and supraclinoid segments widely patent bilaterally. Small 3 mm focal outpouching extending from the cavernous right ICA, suspicious for small aneurysm (series 9, image 91). ICA termini widely patent. A1 segments patent bilaterally. Left A1 hypoplastic, accounting for the diminutive left ICA is compared to the right. Anterior communicating artery widely patent. Anterior  cerebral arteries widely patent to their distal aspects. No M1 stenosis or occlusion. Normal MCA bifurcations. Distal MCA branches well perfused bilaterally. Avid flow within  the intraventricular lesion on MRA. POSTERIOR CIRCULATION: Vertebral arteries widely patent to the vertebrobasilar junction without stenosis. Left vertebral artery dominant. Partially visualized posterior inferior cerebral arteries patent bilaterally. Basilar widely patent to its distal aspect without stenosis. Superior cerebral arteries patent bilaterally. Both of the posterior cerebral arteries primarily supplied via the basilar, although small bilateral posterior communicating arteries noted. PCAs widely patent to their distal aspects without stenosis. IMPRESSION: MRI HEAD IMPRESSION: 1. 8 mm subacute right holo hemispheric subdural hematoma. Mild mass effect on the subjacent right cerebral hemisphere without midline shift. 2. 9 mm round intraventricular lesion arising from the septum pellucidum. Finding is of uncertain etiology, and could reflect an underlying intraventricular neoplasm. Alternatively, a vascular lesion could also be considered. Follow-up examination with postcontrast imaging suggested for complete evaluation. Additionally, further assessment with dedicated angiography may be helpful for further characterization. 3. 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 The intraventricular lesion is suspected to be the underlying etiology of this finding. Small aneurysm seen on corresponding MRA as below is felt to be unlikely to cause the degree of chronic hemosiderin staining. 4. Mild chronic small vessel ischemic disease. MRA HEAD IMPRESSION: 1. Negative MRA for large vessel occlusion. No high-grade or correctable stenosis. 2. 3 mm proximal cavernous right ICA aneurysm. Electronically Signed   By: Jeannine Boga M.D.   On: 03/25/2018 03:31     Subjective:   Discharge Exam: Vitals:   03/26/18 0420 03/26/18 0809  BP: 118/65 132/68  Pulse: 66 84  Resp: 16 20  Temp: 97.6 F (36.4 C) (!) 97.5 F (36.4 C)  SpO2: 95% 99%   Vitals:   03/26/18 0038 03/26/18 0354 03/26/18 0420 03/26/18 0809  BP: (!) 148/75 (!) 135/59 118/65 132/68  Pulse: 70 70 66 84  Resp: 20 16 16 20   Temp: 97.8 F (36.6 C) 97.8 F (36.6 C) 97.6 F (36.4 C) (!) 97.5 F (36.4 C)  TempSrc: Oral Oral Oral Oral  SpO2: 95% 94% 95% 99%  Weight:      Height:        General: Pt is alert, awake, not in acute distress Cardiovascular: RRR, S1/S2 +, no rubs, no gallops Respiratory: CTA bilaterally, no wheezing, no rhonchi Abdominal: Soft, NT, ND, bowel sounds + Extremities: no edema, no cyanosis    The results of significant diagnostics from this hospitalization (including imaging, microbiology, ancillary and laboratory) are listed below for reference.     Microbiology: No results found for this or any previous visit (from the past 240 hour(s)).   Labs: BNP (last 3 results) No results for input(s): BNP in the last 8760 hours. Basic Metabolic Panel: Recent Labs  Lab 03/24/18 2152 03/24/18 2208  NA 140 141  K 3.9 3.9  CL 100* 103  CO2 29  --   GLUCOSE 115* 111*  BUN 15 17  CREATININE 0.84 0.80  CALCIUM 9.1  --    Liver Function Tests: Recent Labs  Lab 03/24/18 2152  AST 18  ALT 13*  ALKPHOS 86  BILITOT 0.3  PROT 6.8  ALBUMIN 3.4*   No results for input(s): LIPASE, AMYLASE in the last 168 hours. No results for input(s): AMMONIA in the last 168 hours. CBC: Recent Labs  Lab 03/24/18 2152 03/24/18 2208  WBC 8.4  --   NEUTROABS 4.4  --   HGB 13.6 13.3  HCT 42.6 39.0  MCV 93.0  --   PLT 236  --  Cardiac Enzymes: No results for input(s): CKTOTAL, CKMB, CKMBINDEX, TROPONINI in the last 168 hours. BNP: Invalid input(s): POCBNP CBG: No results for input(s): GLUCAP in the last 168  hours. D-Dimer No results for input(s): DDIMER in the last 72 hours. Hgb A1c Recent Labs    03/25/18 0837  HGBA1C 5.6   Lipid Profile Recent Labs    03/25/18 0837  CHOL 211*  HDL 84  LDLCALC 105*  TRIG 110  CHOLHDL 2.5   Thyroid function studies No results for input(s): TSH, T4TOTAL, T3FREE, THYROIDAB in the last 72 hours.  Invalid input(s): FREET3 Anemia work up No results for input(s): VITAMINB12, FOLATE, FERRITIN, TIBC, IRON, RETICCTPCT in the last 72 hours. Urinalysis    Component Value Date/Time   COLORURINE YELLOW 03/24/2018 2200   APPEARANCEUR CLEAR 03/24/2018 2200   LABSPEC 1.013 03/24/2018 2200   PHURINE 6.0 03/24/2018 2200   GLUCOSEU NEGATIVE 03/24/2018 2200   HGBUR NEGATIVE 03/24/2018 2200   BILIRUBINUR NEGATIVE 03/24/2018 2200   KETONESUR NEGATIVE 03/24/2018 2200   PROTEINUR NEGATIVE 03/24/2018 2200   NITRITE NEGATIVE 03/24/2018 2200   LEUKOCYTESUR TRACE (A) 03/24/2018 2200   Sepsis Labs Invalid input(s): PROCALCITONIN,  WBC,  LACTICIDVEN Microbiology No results found for this or any previous visit (from the past 240 hour(s)).   Time coordinating discharge: Over 30 minutes  SIGNED:   Cristy Folks, MD  [facility] 03/26/2018, 10:31 AM Pager   If 7PM-7AM, please contact night-coverage [email or web address] Password [email or web address]

## 2018-03-26 NOTE — Care Management Note (Signed)
Case Management Note  Patient Details  Name: Lisa Phillips MRN: [id number] Date of Birth: Feb 20, 1931  Subjective/Objective:                    Action/Plan: CM informed Butch Penny with [facility] that pt discharging home today.  Pt has transportation home.  PCP: Dr Jeryl [place] Insurance: Medicare  Expected Discharge Date:  03/26/18               Expected Discharge Plan:  [place]  In-House Referral:     Discharge planning Services  CM Consult  Post Acute Care Choice:  Home Health Choice offered to:  Patient  DME Arranged:    DME Agency:     HH Arranged:  PT, OT, Speech Therapy [place] Agency:  [facility]  Status of Service:  Completed, signed off  If discussed at [place] of Stay Meetings, dates discussed:    Additional Comments:  Pollie Friar, RN 03/26/2018, 11:37 AM

## 2018-03-26 NOTE — Progress Notes (Signed)
Carotid duplex prelim: 1-39% ICA stenosis.  Chauntelle Azpeitia Eunice, RDMS, RVT   

## 2018-03-26 NOTE — Progress Notes (Signed)
Occupational Therapy Treatment Patient Details Name: Lisa Phillips MRN: [id number] DOB: Oct 14, 1930 Today's Date: 03/26/2018    History of present illness Lisa Phillips is a 84 y.o. female with medical history significant for cancer of the left breast cancer status post mastectomy, and seizure disorder, multiple falls with last one 2 weeks ago when out tending to her flowers. Now presenting to the emergency department after a transient episode involving left hand paresthesias and left facial droop with speech difficulty.  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 She was able to complete simulated toilet transfers with min assist for power up to standing and simulated walk-in shower transfers with min guard assist. She requires multiple cues to utilize RW safely and demonstrates decreased attention and awareness impacting her safety with ADL participation. Continue to recommend home health OT follow-up post-acute D/C and 24 hour assistance from pt's daughter. Pt stating that she has steps to navigate and reinforced need to have daughter assist.    Follow Up Recommendations  Home health OT;Supervision/Assistance - 24 hour    Equipment Recommendations  None recommended by OT    Recommendations for Other Services      Precautions / Restrictions Precautions Precautions: Fall Precaution Comments: History of falls with last one 2 weeks ago Restrictions Weight Bearing Restrictions: No       Mobility Bed Mobility Overal bed mobility: Needs Assistance Bed Mobility: Sit to Supine       Sit to supine: Min guard;HOB elevated   General bed mobility  comments: heavy use of bed rail; min guard assist for safety  Transfers Overall transfer level: Needs assistance Equipment used: Rolling walker (2 wheeled) Transfers: Sit to/from Stand Sit to Stand: Min assist         General transfer comment: Hands on assist and increased time to power up to standing.     Balance Overall balance assessment: Needs assistance Sitting-balance support: No upper extremity supported;Feet supported Sitting balance-Leahy Scale: Good     Standing balance support: No upper extremity supported;During functional activity Standing balance-Leahy Scale: Fair Standing balance comment: Able to statically stand without UE support but relies on RW during dynamic tasks.                            ADL either performed or assessed with clinical judgement   ADL Overall ADL's : Needs assistance/impaired     Grooming: Supervision/safety;Standing                   Toilet Transfer: Ambulation;RW;Regular Toilet;Minimal assistance   Toileting- Water quality scientist and Hygiene: Min guard;Sit to/from stand   Tub/ Shower Transfer: Walk-in shower;Min guard;Ambulation;Rolling walker Tub/Shower Transfer Details (indicate cue type and reason): simulated in room; pt with flat walk-in shower and utilized bathroom doorway to simulate. Functional mobility during ADLs: Min guard;Rolling walker General ADL Comments: Pt unsafe with mobility and reports that she plans to leave her walker and furniture walk. Educated pt concerning need to utilize RW for safety and methods to use in her home.      Vision       Perception     Praxis      Cognition Arousal/Alertness: Awake/alert Behavior During Therapy:  WFL for tasks assessed/performed Overall Cognitive Status: No family/caregiver present to determine baseline cognitive functioning Area of Impairment: Attention;Following commands;Safety/judgement;Awareness                   Current Attention  Level: Selective   Following Commands: Follows multi-step commands inconsistently Safety/Judgement: Decreased awareness of safety Awareness: Emergent   General Comments: Pt with decreased awareness of her safety and need for RW use. Difficulty connecting current deficits with function.         Exercises     Shoulder Instructions       General Comments Extensive education concerning safety with RW and need for assistance to get to bedroom upstairs.     Pertinent Vitals/ Pain       Pain Assessment: Faces Faces Pain Scale: No hurt  Home Living                                          Prior Functioning/Environment              Frequency  Min 2X/week        Progress Toward Goals  OT Goals(current goals can now be found in the care plan section)  Progress towards OT goals: Progressing toward goals  Acute Rehab OT Goals Patient Stated Goal: be more independent  Plan Discharge plan remains appropriate    Co-evaluation                 AM-PAC PT "6 Clicks" Daily Activity     Outcome Measure   Help from another person eating meals?: None Help from another person taking care of personal grooming?: A Little Help from another person toileting, which includes using toliet, bedpan, or urinal?: A Little Help from another person bathing (including washing, rinsing, drying)?: A Little Help from another person to put on and taking off regular upper body clothing?: A Little Help from another person to put on and taking off regular lower body clothing?: A Little 6 Click Score: 19    End of Session Equipment Utilized During Treatment: Gait belt;Rolling walker  OT Visit Diagnosis: Unsteadiness on feet (R26.81);Repeated falls (R29.6);History of falling (Z91.81)   Activity Tolerance Patient tolerated treatment well   Patient Left in chair;with call bell/phone within reach;with chair alarm set   Nurse Communication          Time: [phone] OT  Time Calculation (min): 16 min  Charges: OT General Charges $OT Visit: 1 Visit OT Treatments $Self Care/Home Management : 8-22 mins  Norman Herrlich, MS OTR/L  Pager: [place] 03/26/2018, 9:47 AM

## 2018-03-26 NOTE — Discharge Instructions (Signed)
Subdural Hematoma °A subdural hematoma is a collection of blood between the brain and its tough outer covering (dura). As the amount of blood increases, it puts pressure on the brain. °There are two types of subdural hematomas: °· Acute. This type develops shortly after a hard, direct hit (blow) to the head and causes blood to collect very quickly. This is a medical emergency. If it is not diagnosed and treated quickly, it can lead to severe brain injury or death. °· Chronic. This is when bleeding develops more slowly, over weeks or months. ° °What are the causes? °This condition is caused by bleeding (hemorrhage) from a broken (ruptured) blood vessel. In most cases, a blood vessel ruptures and bleeds because of injury (trauma) to the head, such as from a hard, direct hit. Head trauma can happen in: °· Traffic accidents. °· Falls. °· Assaults. °· Sport injuries. ° °In rare cases, hemorrhage can happen without a known cause (spontaneously), especially if you take blood thinners (anticoagulants). °What increases the risk? °This condition is more likely to develop in: °· Older people. °· Infants. °· People who take blood thinners. °· People who have injured their head. °· People who abuse alcohol. ° °What are the signs or symptoms? °Depending on the size of the hematoma, symptoms can vary from mild to severe and life-threatening. Symptoms in acute subdural hematoma can develop over minutes or hours. Symptoms in chronic subdural hematoma may develop over weeks or months. °· Headaches. °· Nausea or vomiting. °· Changes in vision, such as double vision or loss of vision. °· Changes in speech. °· Loss of balance or difficulty walking. °· Weakness, numbness, or tingling in the arms or legs on one side of the body. °· Jerky movements that you cannot control (seizures). °· Change in personality. °· Increased sleepiness. °· Memory loss. °· Loss of consciousness. °· Coma. ° °How is this diagnosed? °This condition is diagnosed  based on the results of: °· A physical and neurological exam. °· CT scan. °· MRI. ° °How is this treated? °Treatment for this condition depends on the severity and the type of subdural hematoma that you have. You may need to temporarily stop taking blood thinners, if this applies. You may be given antiseizure (anticonvulsant) medicine. °Treatment for acute subdural hematoma may include: °· Medicines that help the body get rid of excess fluids (diuretics). These may help reduce pressure in the brain. °· Assisted breathing (ventilation). This involves using a machine called a ventilator to help you breathe. This helps to reduce pressure in the brain, especially if there is swelling of the brain. °· Emergency surgery to drain blood or remove a blood clot. ° °Treatment for chronic subdural hematoma may include: °· Observation and bed rest at the hospital. °· Emergency surgery. This may be done if the bleeding is large, or if you have neurological symptoms such as weakness or numbness. ° °Sometimes, no treatment is needed for chronic subdural hematoma. °Follow these instructions at home: °Activity °· Avoid any situation where there is potential for another head injury, such as football, hockey, soccer, basketball, martial arts, downhill snow sports, and horseback riding. Do not do these activities until your health care provider approves. °? If you play a contact sport and you experience a head injury, follow advice from your health care provider about when you can return to the sport. If you get another injury while you are healing, you may experience another hemorrhage. °· Avoid excessive visual stimulation while recovering. This includes working   on the computer, watching TV, and reading. °· Try to avoid activities that cause physical or mental stress. Stay home from work or school as directed by your health care provider. °· Do not drive, ride a bicycle, or use heavy machinery until your health care provider  approves. °· Do not lift anything that is heavier than 5 lb (2.3 kg) until your health care provider approves. °· If physical therapy was prescribed, do exercises as told by your health care provider or physical therapist. °· Rest as told by your health care provider. Rest helps the brain to heal. °· Make sure you: °? Get plenty of sleep. Avoid staying up late at night. °? Keep a consistent sleep schedule. Try to go to sleep and wake up at about the same time every day. °General instructions °· Recovery from brain injuries varies widely. Talk with your health care provider about what to expect. Monitor your symptoms, and ask people around you to do the same. °· Take over-the-counter and prescription medicines only as told by your health care provider. Do not take blood thinners or NSAIDs unless your health care provider approves. This includes aspirin, ibuprofen, naproxen, and warfarin. °· Limit alcohol intake to no more than 1 drink per day for nonpregnant women and 2 drinks per day for men. One drink equals 12 oz of beer, 5 oz of wine, or 1½ oz of hard liquor. °· Keep all follow-up visits as told by your health care provider. This is important. °How is this prevented? °· Wear protective gear, such as helmets, when participating in activities such as biking or contact sports. °· Always wear a seat belt when you are in a motor vehicle. °· Keep your home environment safe to reduce the risk of falling: °? Remove clutter and tripping hazards from floors and stairways, such as loose rugs and extension cords. °? Use grab bars in bathrooms and handrails by stairs. °? Place non-slip mats on floors and in bathtubs. °? Improve lighting in dim areas. °Where to find more information: °· National Institute of Neurological Disorders and Stroke: www.ninds.nih.gov °· American Association of Neurological Surgeons: http://www.aans.org °· American Academy of Neurology (AAN): www.aan.com °· Brain Injury Association of America:  www.biausa.org °Get help right away if: °· You develop symptoms of subdural hematoma. °· You are taking blood thinners and you fall or you experience minor trauma to the head. If you take any blood thinners, even a very small injury can cause a subdural hematoma. You should get help right away, even if you think your symptoms are mild. °· You have a bleeding disorder and you fall or you experience minor trauma to the head. °· You experience a head injury and you develop any of the following symptoms: °? Clear fluid draining from your nose or ears. °? Nausea. °? Vomiting. °? Slurred speech. °? Seizures. °? Drowsiness or a decrease in alertness. °? Double vision. °? Numbness or inability to move (paralysis) in any part of your body. °? Difficulty walking or poor coordination. °? Difficulty thinking. °? Confusion or forgetfulness. °? Personality changes. °? Irrational or aggressive behavior. °? A history of heavy alcohol use. °These symptoms may represent a serious problem that is an emergency. Do not wait to see if the symptoms will go away. Get medical help right away. Call your local emergency services (911 in the [place] Do not drive yourself to the hospital. °Summary °· A subdural hematoma is a collection of blood between the brain and its tough outer covering. °·   Treatment for this condition depends on the severity and the type of subdural hemorrhage that you have. °· Symptoms can vary from mild to severe and life-threatening. °· Monitor your symptoms, and ask others around you to do the same. °This information is not intended to replace advice given to you by your health care provider. Make sure you discuss any questions you have with your health care provider. °Document Released: 08/06/2004 Document Revised: 08/24/2016 Document Reviewed: 08/24/2016 °Elsevier Interactive Patient Education © 2018 [employer]. ° °

## 2018-03-26 NOTE — Progress Notes (Signed)
STROKE TEAM PROGRESS NOTE      SUBJECTIVE (INTERVAL HISTORY) The patient your echocardiogram is pending. EEG done results pending. Carotid ultrasound showed no significant stenosis.MRI with contrast shows no enhancement in the intraventricular proteinaceous cystic lesion possibly a choroid cyst without malignancy.   OBJECTIVE Temp:  [97.5 F (36.4 C)-98.1 F (36.7 C)] 97.5 F (36.4 C) (06/24 0809) Pulse Rate:  [66-84] 84 (06/24 0809) Cardiac Rhythm: Normal sinus rhythm (06/24 0700) Resp:  [16-20] 20 (06/24 0809) BP: (118-148)/(59-75) 132/68 (06/24 0809) SpO2:  [94 %-99 %] 99 % (06/24 0809)  CBC:  Recent Labs  Lab 03/24/18 2152 03/24/18 2208  WBC 8.4  --   NEUTROABS 4.4  --   HGB 13.6 13.3  HCT 42.6 39.0  MCV 93.0  --   PLT 236  --     Basic Metabolic Panel:  Recent Labs  Lab 03/24/18 2152 03/24/18 2208  NA 140 141  K 3.9 3.9  CL 100* 103  CO2 29  --   GLUCOSE 115* 111*  BUN 15 17  CREATININE 0.84 0.80  CALCIUM 9.1  --     Lipid Panel:     Component Value Date/Time   CHOL 211 (H) 03/25/2018 0837   TRIG 110 03/25/2018 0837   HDL 84 03/25/2018 0837   CHOLHDL 2.5 03/25/2018 0837   VLDL 22 03/25/2018 0837   LDLCALC 105 (H) 03/25/2018 0837   HgbA1c:  Lab Results  Component Value Date   HGBA1C 5.6 03/25/2018   Urine Drug Screen:     Component Value Date/Time   LABOPIA NONE DETECTED 03/24/2018 2200   COCAINSCRNUR NONE DETECTED 03/24/2018 2200   LABBENZ NONE DETECTED 03/24/2018 2200   AMPHETMU NONE DETECTED 03/24/2018 2200   THCU NONE DETECTED 03/24/2018 2200   LABBARB (A) 03/24/2018 2200    Result not available. Reagent lot number recalled by manufacturer.    Alcohol Level     Component Value Date/Time   ETH <10 03/24/2018 2152    IMAGING   Ct Head Wo Contrast 03/24/2018 IMPRESSION:  1. Thin 6 mm right frontoparietal subdural hematoma, mostly subacute in appearance with small scattered foci of petechial more acute appearing hemorrhage.  Small foci of hemorrhage along the septum lucent on with probable loculated small focus of hemorrhage within the left frontal horn of the ventricle. No midline shift. No mass effect.  2. Atrophy with mild small vessel ischemic changes of the white matter   Mr [facility] Wo Contrast 03/25/2018 IMPRESSION:   MRI HEAD  1. 8 mm subacute right holo hemispheric subdural hematoma. Mild mass effect on the subjacent right cerebral hemisphere without midline shift.  2. 9 mm round intraventricular lesion arising from the septum pellucidum. Finding is of uncertain etiology, and could reflect an underlying intraventricular neoplasm. Alternatively, a vascular lesion could also be considered. Follow-up examination with postcontrast imaging suggested for complete evaluation. Additionally, further assessment with dedicated angiography may be helpful for further characterization.  3. 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 The intraventricular lesion is suspected to be the underlying etiology of this finding. Small aneurysm seen on corresponding MRA as below is felt to be unlikely to cause the degree of chronic hemosiderin staining.  4. Mild chronic small vessel ischemic disease.     MRA HEAD IMPRESSION:  1. Negative MRA for large vessel occlusion. No high-grade or correctable stenosis.  2. 3 mm proximal cavernous right ICA aneurysm.  MR Brain W Contrast - 9 mm left lateral intraventricular mass without convincing enhancement. The signal characteristics favor a proteinaceous cyst, and colloid cysts can rarely occur in the lateral ventricles    Transthoracic Echocardiogram - pending 03/25/2018    Bilateral Carotid Dopplers -b/l 1-39% ICA stenosis    PHYSICAL EXAM Vitals:   03/26/18 0038 03/26/18 0354 03/26/18 0420 03/26/18 0809  BP: (!) 148/75 (!) 135/59 118/65 132/68   Pulse: 70 70 66 84  Resp: 20 16 16 20   Temp: 97.8 F (36.6 C) 97.8 F (36.6 C) 97.6 F (36.4 C) (!) 97.5 F (36.4 C)  TempSrc: Oral Oral Oral Oral  SpO2: 95% 94% 95% 99%  Weight:      Height:       Pleasant elderly lady not in distress. . Afebrile. Head is nontraumatic. Neck is supple without bruit.    Cardiac exam no murmur or gallop. Lungs are clear to auscultation. Distal pulses are well felt.  Neurological Exam ;  Awake  Alert oriented x 3. Normal speech and language.eye movements full without nystagmus.fundi were not visualized. Vision acuity and fields appear normal. Hearing is normal. Palatal movements are normal. Face symmetric. Tongue midline. Normal strength, tone, reflexes and coordination except mild bilateral LE proximal symmetric weakness 4/5. Normal sensation. Gait deferred.           ASSESSMENT/PLAN [name] is a 84 y.o. female with history of arthritis, hyperlipidemia, and a seizure disorder presenting with transient left facial droop, and left hand tingling. She did not receive IV t-PA due to SDH.  8 mm subacute right holo hemispheric subacute subdural hematoma.likley s/p fall in May 2019. Transient left face droop and hand mumbness - TIA from small vessel disease  Or simple partial seizure  Resultant - resolution of deficits.  CT head - Thin 6 mm right frontoparietal subdural hematoma.  EEG - pending  MRI head - 8 mm subacute right holo hemispheric subdural hematoma.  Chronic IVH.  MRA head - 3 mm proximal cavernous right ICA aneurysm.  MRI W Contrast - 9 mm left lateral intraventricular mass without convincing enhancement. The signal characteristics favor a proteinaceous cyst,and colloid cysts can rarely occur in the lateral ventricles Carotid Doppler - no significant stenosis  2D Echo - pending  LDL - 105  HgbA1c - 5.6  VTE prophylaxis - SCDs Diet Order           Diet - low sodium heart healthy        Diet Heart Room service  appropriate? Yes; Fluid consistency: Thin  Diet effective now          aspirin 81 mg daily prior to admission, now on No antithrombotic  Ongoing aggressive stroke risk factor management  Therapy recommendations: Home health PT and OT  Disposition:  Pending  Hypertension  Mildly elevated but stable . Long-term BP goal normotensive  Hyperlipidemia  Lipid lowering medication PTA: Mevacor 20 mg daily  LDL 105, goal < 70  Current lipid lowering medication: Pravastatin 40 mg daily  Continue statin at discharge    Other Stroke Risk Factors  Advanced age  Former cigarette smoker - quit  Obesity, Body mass index is 34 kg/m., recommend weight loss, diet and exercise as appropriate    Other Active Problems  3 mm proximal cavernous right ICA aneurysm -outpatient follow-up Dr. Leonie Man.   9 mm round intraventricular lesion -MR preliminary - benign lesion  Seizure disorder - continue phenobarbital  Stroke prophylaxis -resume aspirin 81  mg daily per Dr. Leonie Man.   Plan / Recommendations   Home health therapies  Follow-up Dr. Leonie Man in 6 to 8 weeks  Await EEG report and  2D echo  Continue pravastatin 40 mg daily . continue phenobarbital  Continue aspirin 81 mg daily   Hospital day # 0   I have personally examined this patient, reviewed notes, independently viewed imaging studies, participated in medical decision making and plan of care.ROS completed by me personally and pertinent positives fully documented  I have made any additions or clarifications directly to the above note.  She presented with transient facial droop and left hand paresthesias possibly TIA due to small vessel disease though simple partial seizure is also possibility given subacute right subdural hematoma following a fall in May 2019.  Continue phenobarbital for a remote history of seizures and now recent subdural as well as aspirin 81 mg daily for possible TIA.  Long discussion with the patient  at the  bedside and answered questions.  Greater than 50% time during this 25-minute visit was spent in counseling and coordination of care about seizures, TIA, subdural and answering questions.discussed with Dr. Carroll Kinds, MD Medical Director [facility] Pager: [phone] 03/26/2018 12:24 PM   To contact Stroke Continuity provider, please refer to [email or web address] After hours, contact General Neurology

## 2018-03-26 NOTE — Progress Notes (Signed)
EEG Completed; Results Pending  

## 2018-03-26 NOTE — Progress Notes (Signed)
OT Cancellation Note  Patient Details Name: Nefertari Rebman MRN: [id number] DOB: [birth year]   Cancelled Treatment:    Reason Eval/Treat Not Completed: Patient at procedure or test/ unavailable. Pt off unit for testing. Will check back as able.   Norman Herrlich, MS OTR/L  Pager: [phone]   Norman Herrlich 03/26/2018, 8:46 AM

## 2018-03-27 DIAGNOSIS — Z9012 Acquired absence of left breast and nipple: Secondary | ICD-10-CM | POA: Diagnosis not present

## 2018-03-27 DIAGNOSIS — Z853 Personal history of malignant neoplasm of breast: Secondary | ICD-10-CM | POA: Diagnosis not present

## 2018-03-27 DIAGNOSIS — I615 Nontraumatic intracerebral hemorrhage, intraventricular: Secondary | ICD-10-CM | POA: Diagnosis not present

## 2018-03-27 DIAGNOSIS — G40909 Epilepsy, unspecified, not intractable, without status epilepticus: Secondary | ICD-10-CM | POA: Diagnosis not present

## 2018-03-27 DIAGNOSIS — E785 Hyperlipidemia, unspecified: Secondary | ICD-10-CM | POA: Diagnosis not present

## 2018-03-27 DIAGNOSIS — G9389 Other specified disorders of brain: Secondary | ICD-10-CM | POA: Diagnosis not present

## 2018-03-27 DIAGNOSIS — I671 Cerebral aneurysm, nonruptured: Secondary | ICD-10-CM | POA: Diagnosis not present

## 2018-03-29 DIAGNOSIS — I671 Cerebral aneurysm, nonruptured: Secondary | ICD-10-CM | POA: Diagnosis not present

## 2018-03-29 DIAGNOSIS — Z853 Personal history of malignant neoplasm of breast: Secondary | ICD-10-CM | POA: Diagnosis not present

## 2018-03-29 DIAGNOSIS — G40909 Epilepsy, unspecified, not intractable, without status epilepticus: Secondary | ICD-10-CM | POA: Diagnosis not present

## 2018-03-29 DIAGNOSIS — E785 Hyperlipidemia, unspecified: Secondary | ICD-10-CM | POA: Diagnosis not present

## 2018-03-29 DIAGNOSIS — I615 Nontraumatic intracerebral hemorrhage, intraventricular: Secondary | ICD-10-CM | POA: Diagnosis not present

## 2018-03-29 DIAGNOSIS — G9389 Other specified disorders of brain: Secondary | ICD-10-CM | POA: Diagnosis not present

## 2018-03-30 DIAGNOSIS — G40909 Epilepsy, unspecified, not intractable, without status epilepticus: Secondary | ICD-10-CM | POA: Diagnosis not present

## 2018-03-30 DIAGNOSIS — Z7189 Other specified counseling: Secondary | ICD-10-CM | POA: Diagnosis not present

## 2018-03-30 DIAGNOSIS — S065X9A Traumatic subdural hemorrhage with loss of consciousness of unspecified duration, initial encounter: Secondary | ICD-10-CM | POA: Diagnosis not present

## 2018-03-30 DIAGNOSIS — M542 Cervicalgia: Secondary | ICD-10-CM | POA: Diagnosis not present

## 2018-04-03 DIAGNOSIS — G9389 Other specified disorders of brain: Secondary | ICD-10-CM | POA: Diagnosis not present

## 2018-04-03 DIAGNOSIS — I615 Nontraumatic intracerebral hemorrhage, intraventricular: Secondary | ICD-10-CM | POA: Diagnosis not present

## 2018-04-03 DIAGNOSIS — E785 Hyperlipidemia, unspecified: Secondary | ICD-10-CM | POA: Diagnosis not present

## 2018-04-03 DIAGNOSIS — G40909 Epilepsy, unspecified, not intractable, without status epilepticus: Secondary | ICD-10-CM | POA: Diagnosis not present

## 2018-04-03 DIAGNOSIS — I671 Cerebral aneurysm, nonruptured: Secondary | ICD-10-CM | POA: Diagnosis not present

## 2018-04-03 DIAGNOSIS — Z853 Personal history of malignant neoplasm of breast: Secondary | ICD-10-CM | POA: Diagnosis not present

## 2018-04-04 DIAGNOSIS — E785 Hyperlipidemia, unspecified: Secondary | ICD-10-CM | POA: Diagnosis not present

## 2018-04-04 DIAGNOSIS — Z853 Personal history of malignant neoplasm of breast: Secondary | ICD-10-CM | POA: Diagnosis not present

## 2018-04-04 DIAGNOSIS — I671 Cerebral aneurysm, nonruptured: Secondary | ICD-10-CM | POA: Diagnosis not present

## 2018-04-04 DIAGNOSIS — G9389 Other specified disorders of brain: Secondary | ICD-10-CM | POA: Diagnosis not present

## 2018-04-04 DIAGNOSIS — G40909 Epilepsy, unspecified, not intractable, without status epilepticus: Secondary | ICD-10-CM | POA: Diagnosis not present

## 2018-04-04 DIAGNOSIS — I615 Nontraumatic intracerebral hemorrhage, intraventricular: Secondary | ICD-10-CM | POA: Diagnosis not present

## 2018-04-06 DIAGNOSIS — E785 Hyperlipidemia, unspecified: Secondary | ICD-10-CM | POA: Diagnosis not present

## 2018-04-06 DIAGNOSIS — G40909 Epilepsy, unspecified, not intractable, without status epilepticus: Secondary | ICD-10-CM | POA: Diagnosis not present

## 2018-04-06 DIAGNOSIS — I671 Cerebral aneurysm, nonruptured: Secondary | ICD-10-CM | POA: Diagnosis not present

## 2018-04-06 DIAGNOSIS — G9389 Other specified disorders of brain: Secondary | ICD-10-CM | POA: Diagnosis not present

## 2018-04-06 DIAGNOSIS — I615 Nontraumatic intracerebral hemorrhage, intraventricular: Secondary | ICD-10-CM | POA: Diagnosis not present

## 2018-04-06 DIAGNOSIS — Z853 Personal history of malignant neoplasm of breast: Secondary | ICD-10-CM | POA: Diagnosis not present

## 2018-04-09 DIAGNOSIS — G9389 Other specified disorders of brain: Secondary | ICD-10-CM | POA: Diagnosis not present

## 2018-04-09 DIAGNOSIS — E785 Hyperlipidemia, unspecified: Secondary | ICD-10-CM | POA: Diagnosis not present

## 2018-04-09 DIAGNOSIS — I671 Cerebral aneurysm, nonruptured: Secondary | ICD-10-CM | POA: Diagnosis not present

## 2018-04-09 DIAGNOSIS — G40909 Epilepsy, unspecified, not intractable, without status epilepticus: Secondary | ICD-10-CM | POA: Diagnosis not present

## 2018-04-09 DIAGNOSIS — I615 Nontraumatic intracerebral hemorrhage, intraventricular: Secondary | ICD-10-CM | POA: Diagnosis not present

## 2018-04-09 DIAGNOSIS — Z853 Personal history of malignant neoplasm of breast: Secondary | ICD-10-CM | POA: Diagnosis not present

## 2018-04-12 DIAGNOSIS — Z79899 Other long term (current) drug therapy: Secondary | ICD-10-CM | POA: Diagnosis not present

## 2018-04-12 DIAGNOSIS — Z980 Intestinal bypass and anastomosis status: Secondary | ICD-10-CM | POA: Diagnosis not present

## 2018-04-12 DIAGNOSIS — Z853 Personal history of malignant neoplasm of breast: Secondary | ICD-10-CM | POA: Diagnosis not present

## 2018-04-12 DIAGNOSIS — Z1211 Encounter for screening for malignant neoplasm of colon: Secondary | ICD-10-CM | POA: Diagnosis not present

## 2018-04-12 DIAGNOSIS — Z85820 Personal history of malignant melanoma of skin: Secondary | ICD-10-CM | POA: Diagnosis not present

## 2018-04-12 DIAGNOSIS — E7800 Pure hypercholesterolemia, unspecified: Secondary | ICD-10-CM | POA: Diagnosis not present

## 2018-04-12 DIAGNOSIS — Z7982 Long term (current) use of aspirin: Secondary | ICD-10-CM | POA: Diagnosis not present

## 2018-04-12 DIAGNOSIS — K5730 Diverticulosis of large intestine without perforation or abscess without bleeding: Secondary | ICD-10-CM | POA: Diagnosis not present

## 2018-04-12 DIAGNOSIS — Z08 Encounter for follow-up examination after completed treatment for malignant neoplasm: Secondary | ICD-10-CM | POA: Diagnosis not present

## 2018-04-12 DIAGNOSIS — C183 Malignant neoplasm of hepatic flexure: Secondary | ICD-10-CM | POA: Diagnosis not present

## 2018-04-12 DIAGNOSIS — Z87891 Personal history of nicotine dependence: Secondary | ICD-10-CM | POA: Diagnosis not present

## 2018-04-12 DIAGNOSIS — Z85038 Personal history of other malignant neoplasm of large intestine: Secondary | ICD-10-CM | POA: Diagnosis not present

## 2018-04-18 DIAGNOSIS — I671 Cerebral aneurysm, nonruptured: Secondary | ICD-10-CM | POA: Diagnosis not present

## 2018-04-18 DIAGNOSIS — E785 Hyperlipidemia, unspecified: Secondary | ICD-10-CM | POA: Diagnosis not present

## 2018-04-18 DIAGNOSIS — Z853 Personal history of malignant neoplasm of breast: Secondary | ICD-10-CM | POA: Diagnosis not present

## 2018-04-18 DIAGNOSIS — G40909 Epilepsy, unspecified, not intractable, without status epilepticus: Secondary | ICD-10-CM | POA: Diagnosis not present

## 2018-04-18 DIAGNOSIS — G9389 Other specified disorders of brain: Secondary | ICD-10-CM | POA: Diagnosis not present

## 2018-04-18 DIAGNOSIS — I615 Nontraumatic intracerebral hemorrhage, intraventricular: Secondary | ICD-10-CM | POA: Diagnosis not present

## 2018-05-01 DIAGNOSIS — W19XXXA Unspecified fall, initial encounter: Secondary | ICD-10-CM | POA: Diagnosis not present

## 2018-05-01 DIAGNOSIS — Y92009 Unspecified place in unspecified non-institutional (private) residence as the place of occurrence of the external cause: Secondary | ICD-10-CM | POA: Diagnosis not present

## 2018-05-01 DIAGNOSIS — S065X9A Traumatic subdural hemorrhage with loss of consciousness of unspecified duration, initial encounter: Secondary | ICD-10-CM | POA: Diagnosis not present

## 2018-05-01 DIAGNOSIS — Z6833 Body mass index (BMI) 33.0-33.9, adult: Secondary | ICD-10-CM | POA: Diagnosis not present

## 2018-05-04 DIAGNOSIS — Z85820 Personal history of malignant melanoma of skin: Secondary | ICD-10-CM | POA: Diagnosis not present

## 2018-05-04 DIAGNOSIS — Z86000 Personal history of in-situ neoplasm of breast: Secondary | ICD-10-CM | POA: Diagnosis not present

## 2018-05-04 DIAGNOSIS — Z85048 Personal history of other malignant neoplasm of rectum, rectosigmoid junction, and anus: Secondary | ICD-10-CM | POA: Diagnosis not present

## 2018-05-26 DIAGNOSIS — N3001 Acute cystitis with hematuria: Secondary | ICD-10-CM | POA: Diagnosis not present

## 2018-05-26 DIAGNOSIS — R300 Dysuria: Secondary | ICD-10-CM | POA: Diagnosis not present

## 2018-05-26 DIAGNOSIS — N898 Other specified noninflammatory disorders of vagina: Secondary | ICD-10-CM | POA: Diagnosis not present

## 2018-06-01 DIAGNOSIS — N3000 Acute cystitis without hematuria: Secondary | ICD-10-CM | POA: Diagnosis not present

## 2018-06-01 DIAGNOSIS — B373 Candidiasis of vulva and vagina: Secondary | ICD-10-CM | POA: Diagnosis not present

## 2018-06-01 DIAGNOSIS — Z6833 Body mass index (BMI) 33.0-33.9, adult: Secondary | ICD-10-CM | POA: Diagnosis not present

## 2018-06-05 DIAGNOSIS — Z1231 Encounter for screening mammogram for malignant neoplasm of breast: Secondary | ICD-10-CM | POA: Diagnosis not present

## 2018-06-12 DIAGNOSIS — B373 Candidiasis of vulva and vagina: Secondary | ICD-10-CM | POA: Diagnosis not present

## 2018-06-12 DIAGNOSIS — R202 Paresthesia of skin: Secondary | ICD-10-CM | POA: Diagnosis not present

## 2018-06-12 DIAGNOSIS — Z6833 Body mass index (BMI) 33.0-33.9, adult: Secondary | ICD-10-CM | POA: Diagnosis not present

## 2018-06-12 DIAGNOSIS — N3000 Acute cystitis without hematuria: Secondary | ICD-10-CM | POA: Diagnosis not present

## 2018-06-13 DIAGNOSIS — A419 Sepsis, unspecified organism: Secondary | ICD-10-CM | POA: Diagnosis not present

## 2018-06-13 DIAGNOSIS — E785 Hyperlipidemia, unspecified: Secondary | ICD-10-CM | POA: Diagnosis not present

## 2018-06-13 DIAGNOSIS — C189 Malignant neoplasm of colon, unspecified: Secondary | ICD-10-CM | POA: Diagnosis not present

## 2018-06-13 DIAGNOSIS — B9689 Other specified bacterial agents as the cause of diseases classified elsewhere: Secondary | ICD-10-CM | POA: Diagnosis not present

## 2018-06-13 DIAGNOSIS — D0512 Intraductal carcinoma in situ of left breast: Secondary | ICD-10-CM | POA: Diagnosis not present

## 2018-06-13 DIAGNOSIS — R000 Tachycardia, unspecified: Secondary | ICD-10-CM | POA: Diagnosis not present

## 2018-06-13 DIAGNOSIS — R531 Weakness: Secondary | ICD-10-CM | POA: Diagnosis not present

## 2018-06-13 DIAGNOSIS — R0902 Hypoxemia: Secondary | ICD-10-CM | POA: Diagnosis not present

## 2018-06-13 DIAGNOSIS — R569 Unspecified convulsions: Secondary | ICD-10-CM | POA: Diagnosis not present

## 2018-06-13 DIAGNOSIS — N390 Urinary tract infection, site not specified: Secondary | ICD-10-CM | POA: Diagnosis not present

## 2018-06-14 DIAGNOSIS — C189 Malignant neoplasm of colon, unspecified: Secondary | ICD-10-CM | POA: Diagnosis not present

## 2018-06-14 DIAGNOSIS — N390 Urinary tract infection, site not specified: Secondary | ICD-10-CM | POA: Diagnosis not present

## 2018-06-14 DIAGNOSIS — R569 Unspecified convulsions: Secondary | ICD-10-CM | POA: Diagnosis not present

## 2018-06-14 DIAGNOSIS — E785 Hyperlipidemia, unspecified: Secondary | ICD-10-CM | POA: Diagnosis not present

## 2018-06-14 DIAGNOSIS — R531 Weakness: Secondary | ICD-10-CM | POA: Diagnosis not present

## 2018-06-14 DIAGNOSIS — D0512 Intraductal carcinoma in situ of left breast: Secondary | ICD-10-CM | POA: Diagnosis not present

## 2018-06-18 DIAGNOSIS — E785 Hyperlipidemia, unspecified: Secondary | ICD-10-CM | POA: Diagnosis not present

## 2018-06-18 DIAGNOSIS — Z9181 History of falling: Secondary | ICD-10-CM | POA: Diagnosis not present

## 2018-06-18 DIAGNOSIS — Z853 Personal history of malignant neoplasm of breast: Secondary | ICD-10-CM | POA: Diagnosis not present

## 2018-06-18 DIAGNOSIS — Z792 Long term (current) use of antibiotics: Secondary | ICD-10-CM | POA: Diagnosis not present

## 2018-06-18 DIAGNOSIS — N3000 Acute cystitis without hematuria: Secondary | ICD-10-CM | POA: Diagnosis not present

## 2018-06-18 DIAGNOSIS — M6281 Muscle weakness (generalized): Secondary | ICD-10-CM | POA: Diagnosis not present

## 2018-06-18 DIAGNOSIS — G40909 Epilepsy, unspecified, not intractable, without status epilepticus: Secondary | ICD-10-CM | POA: Diagnosis not present

## 2018-06-18 DIAGNOSIS — G4710 Hypersomnia, unspecified: Secondary | ICD-10-CM | POA: Diagnosis not present

## 2018-06-18 DIAGNOSIS — Z9189 Other specified personal risk factors, not elsewhere classified: Secondary | ICD-10-CM | POA: Diagnosis not present

## 2018-06-18 DIAGNOSIS — N390 Urinary tract infection, site not specified: Secondary | ICD-10-CM | POA: Diagnosis not present

## 2018-06-18 DIAGNOSIS — E538 Deficiency of other specified B group vitamins: Secondary | ICD-10-CM | POA: Diagnosis not present

## 2018-06-18 DIAGNOSIS — R5381 Other malaise: Secondary | ICD-10-CM | POA: Diagnosis not present

## 2018-06-18 DIAGNOSIS — Z85038 Personal history of other malignant neoplasm of large intestine: Secondary | ICD-10-CM | POA: Diagnosis not present

## 2018-06-18 DIAGNOSIS — Z9012 Acquired absence of left breast and nipple: Secondary | ICD-10-CM | POA: Diagnosis not present

## 2018-06-21 DIAGNOSIS — Z9181 History of falling: Secondary | ICD-10-CM | POA: Diagnosis not present

## 2018-06-21 DIAGNOSIS — Z853 Personal history of malignant neoplasm of breast: Secondary | ICD-10-CM | POA: Diagnosis not present

## 2018-06-21 DIAGNOSIS — M6281 Muscle weakness (generalized): Secondary | ICD-10-CM | POA: Diagnosis not present

## 2018-06-21 DIAGNOSIS — G40909 Epilepsy, unspecified, not intractable, without status epilepticus: Secondary | ICD-10-CM | POA: Diagnosis not present

## 2018-06-21 DIAGNOSIS — N390 Urinary tract infection, site not specified: Secondary | ICD-10-CM | POA: Diagnosis not present

## 2018-06-21 DIAGNOSIS — E785 Hyperlipidemia, unspecified: Secondary | ICD-10-CM | POA: Diagnosis not present

## 2018-06-26 DIAGNOSIS — E785 Hyperlipidemia, unspecified: Secondary | ICD-10-CM | POA: Diagnosis not present

## 2018-06-26 DIAGNOSIS — Z853 Personal history of malignant neoplasm of breast: Secondary | ICD-10-CM | POA: Diagnosis not present

## 2018-06-26 DIAGNOSIS — G40909 Epilepsy, unspecified, not intractable, without status epilepticus: Secondary | ICD-10-CM | POA: Diagnosis not present

## 2018-06-26 DIAGNOSIS — N390 Urinary tract infection, site not specified: Secondary | ICD-10-CM | POA: Diagnosis not present

## 2018-06-26 DIAGNOSIS — Z9181 History of falling: Secondary | ICD-10-CM | POA: Diagnosis not present

## 2018-06-26 DIAGNOSIS — M6281 Muscle weakness (generalized): Secondary | ICD-10-CM | POA: Diagnosis not present

## 2018-06-27 DIAGNOSIS — M6281 Muscle weakness (generalized): Secondary | ICD-10-CM | POA: Diagnosis not present

## 2018-06-29 DIAGNOSIS — Z9181 History of falling: Secondary | ICD-10-CM | POA: Diagnosis not present

## 2018-06-29 DIAGNOSIS — Z853 Personal history of malignant neoplasm of breast: Secondary | ICD-10-CM | POA: Diagnosis not present

## 2018-06-29 DIAGNOSIS — M6281 Muscle weakness (generalized): Secondary | ICD-10-CM | POA: Diagnosis not present

## 2018-06-29 DIAGNOSIS — E785 Hyperlipidemia, unspecified: Secondary | ICD-10-CM | POA: Diagnosis not present

## 2018-06-29 DIAGNOSIS — G40909 Epilepsy, unspecified, not intractable, without status epilepticus: Secondary | ICD-10-CM | POA: Diagnosis not present

## 2018-06-29 DIAGNOSIS — N390 Urinary tract infection, site not specified: Secondary | ICD-10-CM | POA: Diagnosis not present

## 2018-07-03 DIAGNOSIS — Z9181 History of falling: Secondary | ICD-10-CM | POA: Diagnosis not present

## 2018-07-03 DIAGNOSIS — G40909 Epilepsy, unspecified, not intractable, without status epilepticus: Secondary | ICD-10-CM | POA: Diagnosis not present

## 2018-07-03 DIAGNOSIS — N390 Urinary tract infection, site not specified: Secondary | ICD-10-CM | POA: Diagnosis not present

## 2018-07-03 DIAGNOSIS — M6281 Muscle weakness (generalized): Secondary | ICD-10-CM | POA: Diagnosis not present

## 2018-07-03 DIAGNOSIS — E785 Hyperlipidemia, unspecified: Secondary | ICD-10-CM | POA: Diagnosis not present

## 2018-07-03 DIAGNOSIS — Z853 Personal history of malignant neoplasm of breast: Secondary | ICD-10-CM | POA: Diagnosis not present

## 2018-07-06 DIAGNOSIS — M6281 Muscle weakness (generalized): Secondary | ICD-10-CM | POA: Diagnosis not present

## 2018-07-06 DIAGNOSIS — G40909 Epilepsy, unspecified, not intractable, without status epilepticus: Secondary | ICD-10-CM | POA: Diagnosis not present

## 2018-07-06 DIAGNOSIS — N390 Urinary tract infection, site not specified: Secondary | ICD-10-CM | POA: Diagnosis not present

## 2018-07-06 DIAGNOSIS — Z9181 History of falling: Secondary | ICD-10-CM | POA: Diagnosis not present

## 2018-07-06 DIAGNOSIS — Z853 Personal history of malignant neoplasm of breast: Secondary | ICD-10-CM | POA: Diagnosis not present

## 2018-07-06 DIAGNOSIS — E785 Hyperlipidemia, unspecified: Secondary | ICD-10-CM | POA: Diagnosis not present

## 2018-07-09 DIAGNOSIS — N3090 Cystitis, unspecified without hematuria: Secondary | ICD-10-CM | POA: Diagnosis not present

## 2018-07-09 DIAGNOSIS — Z23 Encounter for immunization: Secondary | ICD-10-CM | POA: Diagnosis not present

## 2018-07-09 DIAGNOSIS — Z6832 Body mass index (BMI) 32.0-32.9, adult: Secondary | ICD-10-CM | POA: Diagnosis not present

## 2018-07-11 DIAGNOSIS — Z853 Personal history of malignant neoplasm of breast: Secondary | ICD-10-CM | POA: Diagnosis not present

## 2018-07-11 DIAGNOSIS — G40909 Epilepsy, unspecified, not intractable, without status epilepticus: Secondary | ICD-10-CM | POA: Diagnosis not present

## 2018-07-11 DIAGNOSIS — E785 Hyperlipidemia, unspecified: Secondary | ICD-10-CM | POA: Diagnosis not present

## 2018-07-11 DIAGNOSIS — N390 Urinary tract infection, site not specified: Secondary | ICD-10-CM | POA: Diagnosis not present

## 2018-07-11 DIAGNOSIS — M6281 Muscle weakness (generalized): Secondary | ICD-10-CM | POA: Diagnosis not present

## 2018-07-11 DIAGNOSIS — Z9181 History of falling: Secondary | ICD-10-CM | POA: Diagnosis not present

## 2018-07-13 DIAGNOSIS — Z9181 History of falling: Secondary | ICD-10-CM | POA: Diagnosis not present

## 2018-07-13 DIAGNOSIS — E785 Hyperlipidemia, unspecified: Secondary | ICD-10-CM | POA: Diagnosis not present

## 2018-07-13 DIAGNOSIS — M6281 Muscle weakness (generalized): Secondary | ICD-10-CM | POA: Diagnosis not present

## 2018-07-13 DIAGNOSIS — N390 Urinary tract infection, site not specified: Secondary | ICD-10-CM | POA: Diagnosis not present

## 2018-07-13 DIAGNOSIS — G40909 Epilepsy, unspecified, not intractable, without status epilepticus: Secondary | ICD-10-CM | POA: Diagnosis not present

## 2018-07-13 DIAGNOSIS — Z853 Personal history of malignant neoplasm of breast: Secondary | ICD-10-CM | POA: Diagnosis not present

## 2018-07-30 DIAGNOSIS — N390 Urinary tract infection, site not specified: Secondary | ICD-10-CM | POA: Diagnosis not present

## 2018-08-06 DIAGNOSIS — E538 Deficiency of other specified B group vitamins: Secondary | ICD-10-CM | POA: Diagnosis not present

## 2018-08-06 DIAGNOSIS — Z6833 Body mass index (BMI) 33.0-33.9, adult: Secondary | ICD-10-CM | POA: Diagnosis not present

## 2018-08-06 DIAGNOSIS — G40909 Epilepsy, unspecified, not intractable, without status epilepticus: Secondary | ICD-10-CM | POA: Diagnosis not present

## 2018-08-06 DIAGNOSIS — E785 Hyperlipidemia, unspecified: Secondary | ICD-10-CM | POA: Diagnosis not present

## 2018-08-06 DIAGNOSIS — Z23 Encounter for immunization: Secondary | ICD-10-CM | POA: Diagnosis not present

## 2018-08-29 DIAGNOSIS — B372 Candidiasis of skin and nail: Secondary | ICD-10-CM | POA: Diagnosis not present

## 2018-08-29 DIAGNOSIS — Z6833 Body mass index (BMI) 33.0-33.9, adult: Secondary | ICD-10-CM | POA: Diagnosis not present

## 2018-09-12 DIAGNOSIS — B372 Candidiasis of skin and nail: Secondary | ICD-10-CM | POA: Diagnosis not present

## 2018-09-12 DIAGNOSIS — Z6832 Body mass index (BMI) 32.0-32.9, adult: Secondary | ICD-10-CM | POA: Diagnosis not present

## 2018-09-12 DIAGNOSIS — E538 Deficiency of other specified B group vitamins: Secondary | ICD-10-CM | POA: Diagnosis not present

## 2018-09-12 DIAGNOSIS — N3000 Acute cystitis without hematuria: Secondary | ICD-10-CM | POA: Diagnosis not present

## 2018-10-08 IMAGING — MR MR MRA HEAD W/O CM
10 of 12 series · 31 of 48 positions shown · non-contrast
Comparison: Prior CT from 03/24/2018.

CLINICAL DATA: Initial evaluation for left facial droop, left hand
numbness.

EXAM:
MRI HEAD WITHOUT CONTRAST
MRA HEAD WITHOUT CONTRAST
TECHNIQUE: Multiplanar, multiecho pulse sequences of the brain and surrounding
structures were obtained without intravenous contrast. Angiographic
images of the head were obtained using MRA technique without
contrast.

[Series 5: ax dwi_tracew · axial · 3.0mm · 1.50mm/px · z∈[-42,+92]mm · 5 of 80 slices shown]
[im 1/80]
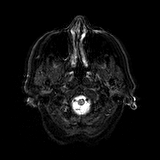
[im 20/80]
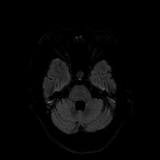
[im 40/80]
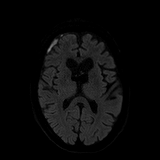
[im 60/80]
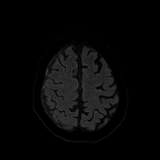
[im 80/80]
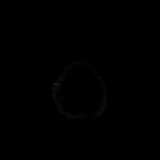

[Series 6: ax dwi_adc · axial · 3.0mm · 1.50mm/px · z∈[-42,+92]mm · 3 of 40 slices shown]
[im 1/40]
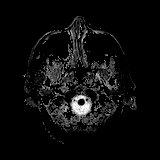
[im 20/40]
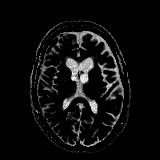
[im 40/40]
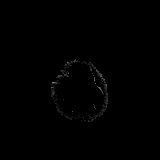

[Series 7: cor dwi_tracew · coronal · 5.0mm · 1.44mm/px · 5 of 68 slices shown]
[im 1/68]
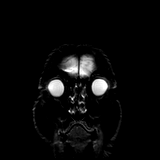
[im 17/68]
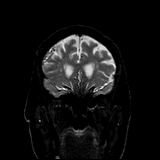
[im 34/68]
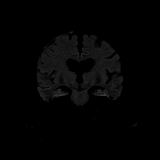
[im 51/68]
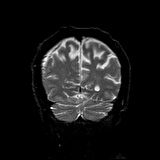
[im 68/68]
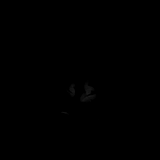

[Series 8: cor dwi_adc · coronal · 5.0mm · 1.44mm/px · 2 of 34 slices shown]
[im 1/34]
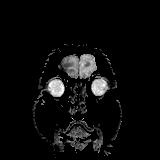
[im 34/34]
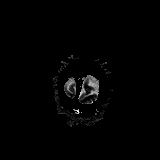

[Series 13: T1 · sagittal · 5.0mm · 0.75mm/px · 2 of 23 slices shown]
[im 1/23]
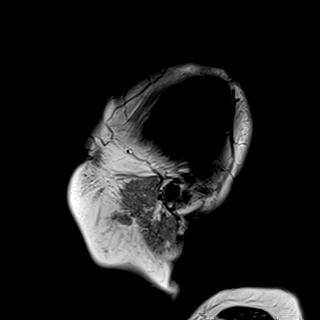
[im 23/23]
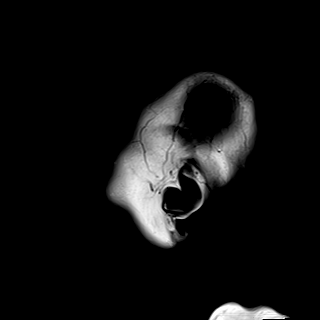

[Series 14: T2 · axial · 5.0mm · 0.69mm/px · z∈[-52,+92]mm · 2 of 25 slices shown (1 of 2)]
[im 1/25]
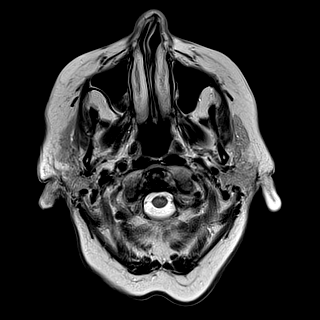
[im 25/25]
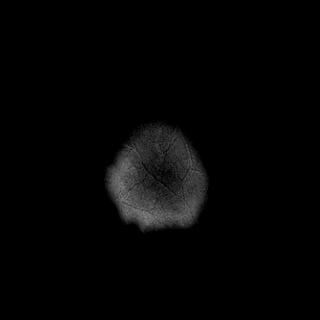

[Series 15: FLAIR · axial · 5.0mm · 0.43mm/px · z∈[-52,+92]mm · 2 of 25 slices shown]
[im 1/25]
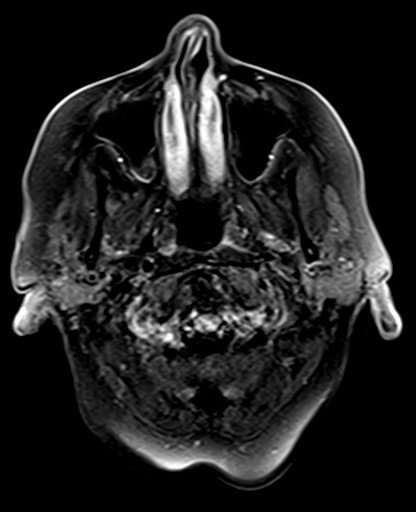
[im 25/25]
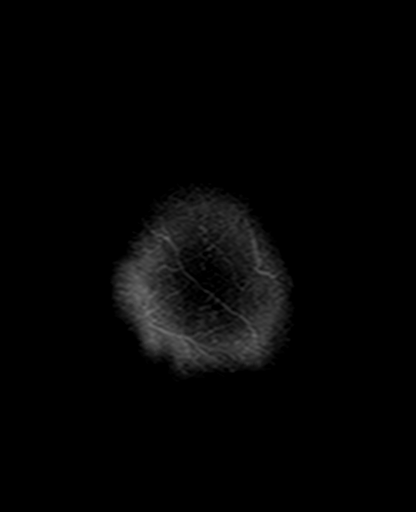

[Series 16: swi_images · axial · 3.0mm · 0.86mm/px · z∈[-82,+95]mm · 4 of 60 slices shown]
[im 1/60]
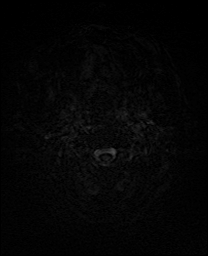
[im 20/60]
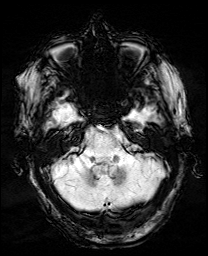
[im 40/60]
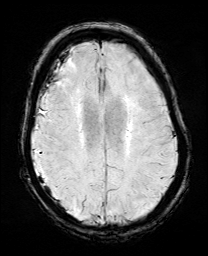
[im 60/60]
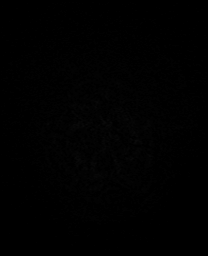

[Series 17: mip_images(sw) · axial · 24.0mm · 0.86mm/px · z∈[-72,+84]mm · 4 of 53 slices shown]
[im 1/53]
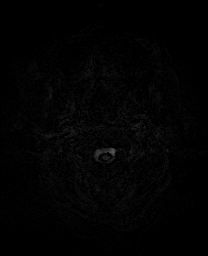
[im 18/53]
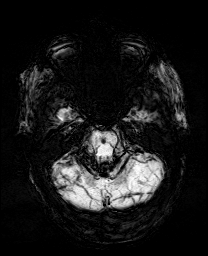
[im 35/53]
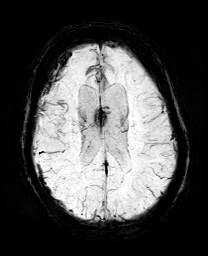
[im 53/53]
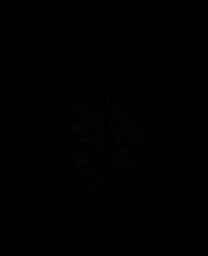

[Series 19: T2 · coronal · 5.0mm · 0.34mm/px · 2 of 29 slices shown (2 of 2)]
[im 1/29]
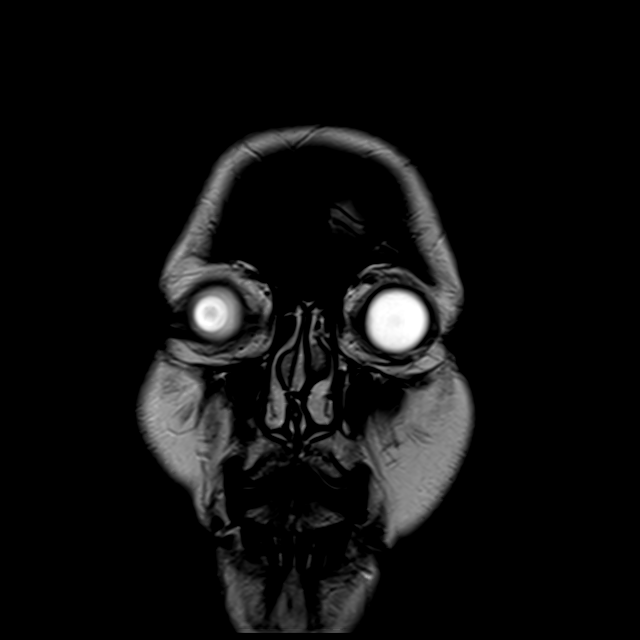
[im 29/29]
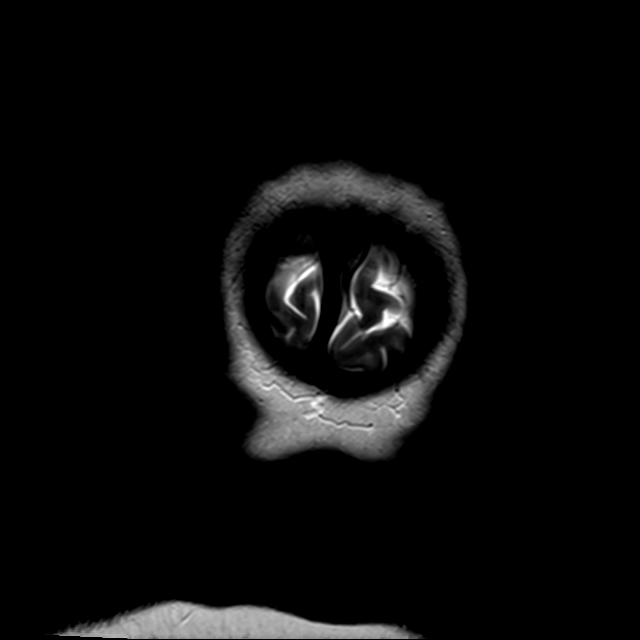

[31 of 48 positions shown; findings below may reference images not displayed]

FINDINGS: MRI HEAD FINDINGS

Brain: Generalized age-related cerebral volume loss. Patchy and
confluent T2/FLAIR hyperintensity within the periventricular and
deep white matter both cerebral hemispheres, most likely related
chronic small vessel ischemic change, mild for age.

No abnormal foci of restricted diffusion to suggest acute or
subacute ischemia. Gray-white matter differentiation maintained. No
areas of remote cortical infarction.

Previously identified right holo hemispheric subacute subdural
hematoma again seen. This measures up to 8 mm in maximal thickness.
Probable small amount of subjacent subarachnoid blood within
underlying right frontal cortical sulci on FLAIR sequence. Mild mass
effect on the subjacent right cerebral hemisphere without midline
shift. Basilar cisterns remain patent.

There is a round T2 hypointense, T1 hyperintense lesion positioned
along the left aspect of the septum pellucidum measuring 9 x 7 x 7
mm (series 18, image 34). While this appeared as possible hemorrhage
on prior CT, this appears more masslike on this examination. Lesion
appears attached to the adjacent septum pellucidum by a small stalk.
Finding could reflect an intraventricular neoplasm possibly a
vascular lesion. Evidence of chronic intraventricular hemorrhage
with susceptibility artifacts seen posteriorly within both lateral
ventricles. No hydrocephalus. Superficial siderosis seen about the
basilar cisterns as well as the cerebellar vermis and bilateral
sylvian fissures, with extension around the brainstem and upper
spinal cord. Finding suggests repeated chronic subarachnoid
hemorrhage.

No other mass lesion. Pituitary gland suprasellar region within
normal limits. Midline structures intact.

Vascular: Major intracranial vascular flow voids are maintained.

Skull and upper cervical spine: Craniocervical junction within
normal limits. Upper cervical spine normal. Bone marrow signal
intensity normal. No scalp soft tissue abnormality.

Sinuses/Orbits: Globes and orbital soft tissues demonstrate no acute
finding. Patient status post ocular lens extraction bilaterally.
Paranasal sinuses are largely clear. No mastoid effusion. Inner ear
structures normal.

Other: None.

MRA HEAD FINDINGS

ANTERIOR CIRCULATION:

Distal cervical segments of the internal carotid arteries are patent
with antegrade flow. Petrous, cavernous, and supraclinoid segments
widely patent bilaterally. Small 3 mm focal outpouching extending
from the cavernous right ICA, suspicious for small aneurysm (series
9, image 91). ICA termini widely patent. A1 segments patent
bilaterally. Left A1 hypoplastic, accounting for the diminutive left
ICA is compared to the right. Anterior communicating artery widely
patent. Anterior cerebral arteries widely patent to their distal
aspects. No M1 stenosis or occlusion. Normal MCA bifurcations.
Distal MCA branches well perfused bilaterally.

Avid flow within the intraventricular lesion on MRA.

POSTERIOR CIRCULATION:

Vertebral arteries widely patent to the vertebrobasilar junction
without stenosis. Left vertebral artery dominant. Partially
visualized posterior inferior cerebral arteries patent bilaterally.
Basilar widely patent to its distal aspect without stenosis.
Superior cerebral arteries patent bilaterally. Both of the posterior
cerebral arteries primarily supplied via the basilar, although small
bilateral posterior communicating arteries noted. PCAs widely patent
to their distal aspects without stenosis.
IMPRESSION: MRI HEAD IMPRESSION:

1. 8 mm subacute right holo hemispheric subdural hematoma. Mild mass
effect on the subjacent right cerebral hemisphere without midline
shift.
2. 9 mm round intraventricular lesion arising from the septum
pellucidum. Finding is of uncertain etiology, and could reflect an
underlying intraventricular neoplasm. Alternatively, a vascular
lesion could also be considered. Follow-up examination with
postcontrast imaging suggested for complete evaluation.
Additionally, further assessment with dedicated angiography may be
helpful for further characterization.
3. Chronic intraventricular hemorrhage with superficial siderosis
overlying the cerebellum and basilar cisterns, suggesting chronic
and/or recurrent subarachnoid hemorrhage. The intraventricular
lesion is suspected to be the underlying etiology of this finding.
Small aneurysm seen on corresponding MRA as below is felt to be
unlikely to cause the degree of chronic hemosiderin staining.
4. Mild chronic small vessel ischemic disease.

MRA HEAD IMPRESSION:

1. Negative MRA for large vessel occlusion. No high-grade or
correctable stenosis.
2. 3 mm proximal cavernous right ICA aneurysm.

## 2019-08-02 DIAGNOSIS — C7902 Secondary malignant neoplasm of left kidney and renal pelvis: Secondary | ICD-10-CM

## 2019-08-02 DIAGNOSIS — J189 Pneumonia, unspecified organism: Secondary | ICD-10-CM

## 2019-08-03 DIAGNOSIS — J189 Pneumonia, unspecified organism: Secondary | ICD-10-CM | POA: Diagnosis not present

## 2019-08-03 DIAGNOSIS — C7902 Secondary malignant neoplasm of left kidney and renal pelvis: Secondary | ICD-10-CM | POA: Diagnosis not present

## 2019-08-04 DIAGNOSIS — C7902 Secondary malignant neoplasm of left kidney and renal pelvis: Secondary | ICD-10-CM | POA: Diagnosis not present

## 2019-08-04 DIAGNOSIS — J189 Pneumonia, unspecified organism: Secondary | ICD-10-CM | POA: Diagnosis not present

## 2019-08-05 DIAGNOSIS — C7902 Secondary malignant neoplasm of left kidney and renal pelvis: Secondary | ICD-10-CM | POA: Diagnosis not present

## 2019-08-05 DIAGNOSIS — J189 Pneumonia, unspecified organism: Secondary | ICD-10-CM | POA: Diagnosis not present

## 2022-01-31 DEATH — deceased
